# Patient Record
Sex: Female | Born: 1960 | Race: Black or African American | Hispanic: No | Marital: Single | Smoking: Never smoker
Health system: Southern US, Community
[De-identification: ages and names within clinical notes are randomized; demographics above are authoritative.]

## PROBLEM LIST (undated history)

## (undated) DIAGNOSIS — G43909 Migraine, unspecified, not intractable, without status migrainosus: Secondary | ICD-10-CM

## (undated) DIAGNOSIS — E039 Hypothyroidism, unspecified: Secondary | ICD-10-CM

## (undated) DIAGNOSIS — E669 Obesity, unspecified: Secondary | ICD-10-CM

## (undated) DIAGNOSIS — R001 Bradycardia, unspecified: Secondary | ICD-10-CM

## (undated) DIAGNOSIS — I10 Essential (primary) hypertension: Secondary | ICD-10-CM

## (undated) DIAGNOSIS — R002 Palpitations: Secondary | ICD-10-CM

## (undated) DIAGNOSIS — G4730 Sleep apnea, unspecified: Secondary | ICD-10-CM

## (undated) DIAGNOSIS — D219 Benign neoplasm of connective and other soft tissue, unspecified: Secondary | ICD-10-CM

## (undated) DIAGNOSIS — K589 Irritable bowel syndrome without diarrhea: Secondary | ICD-10-CM

## (undated) DIAGNOSIS — K449 Diaphragmatic hernia without obstruction or gangrene: Secondary | ICD-10-CM

## (undated) DIAGNOSIS — R079 Chest pain, unspecified: Secondary | ICD-10-CM

## (undated) DIAGNOSIS — D472 Monoclonal gammopathy: Secondary | ICD-10-CM

## (undated) DIAGNOSIS — I8393 Asymptomatic varicose veins of bilateral lower extremities: Secondary | ICD-10-CM

## (undated) DIAGNOSIS — K219 Gastro-esophageal reflux disease without esophagitis: Secondary | ICD-10-CM

## (undated) HISTORY — DX: Monoclonal gammopathy: D47.2

## (undated) HISTORY — PX: ESOPHAGOGASTRODUODENOSCOPY: SHX1529

## (undated) HISTORY — DX: Irritable bowel syndrome, unspecified: K58.9

---

## 2005-11-24 ENCOUNTER — Ambulatory Visit: Payer: Self-pay

## 2008-04-10 ENCOUNTER — Ambulatory Visit: Payer: Self-pay | Admitting: Obstetrics and Gynecology

## 2009-09-23 ENCOUNTER — Ambulatory Visit: Payer: Self-pay | Admitting: Internal Medicine

## 2009-10-10 HISTORY — PX: COLONOSCOPY: SHX174

## 2010-07-01 ENCOUNTER — Other Ambulatory Visit: Payer: Self-pay

## 2010-09-30 ENCOUNTER — Ambulatory Visit: Payer: Self-pay

## 2011-08-19 ENCOUNTER — Emergency Department: Payer: Self-pay | Admitting: Emergency Medicine

## 2011-12-16 ENCOUNTER — Ambulatory Visit: Payer: Self-pay

## 2012-09-10 ENCOUNTER — Other Ambulatory Visit: Payer: Self-pay

## 2012-09-10 LAB — CBC WITH DIFFERENTIAL/PLATELET
Basophil #: 0 10*3/uL (ref 0.0–0.1)
Eosinophil %: 3.5 %
HCT: 35.3 % (ref 35.0–47.0)
Lymphocyte %: 32.4 %
MCV: 90 fL (ref 80–100)
Monocyte %: 10.7 %
Neutrophil #: 2.9 10*3/uL (ref 1.4–6.5)
Platelet: 297 10*3/uL (ref 150–440)
RBC: 3.91 10*6/uL (ref 3.80–5.20)
RDW: 13.3 % (ref 11.5–14.5)
WBC: 5.6 10*3/uL (ref 3.6–11.0)

## 2012-09-10 LAB — COMPREHENSIVE METABOLIC PANEL
Albumin: 3.6 g/dL (ref 3.4–5.0)
Alkaline Phosphatase: 75 U/L (ref 50–136)
BUN: 14 mg/dL (ref 7–18)
Chloride: 102 mmol/L (ref 98–107)
EGFR (African American): >60
Glucose: 88 mg/dL (ref 65–99)
SGOT(AST): 16 U/L (ref 15–37)
SGPT (ALT): 21 U/L (ref 12–78)
Total Protein: 8.6 g/dL — ABNORMAL HIGH (ref 6.4–8.2)

## 2012-09-10 LAB — LIPID PANEL
HDL Cholesterol: 73 mg/dL — ABNORMAL HIGH (ref 40–60)
Triglycerides: 41 mg/dL (ref 0–200)

## 2012-09-10 LAB — TSH: Thyroid Stimulating Horm: 2.05 u[IU]/mL

## 2012-12-25 ENCOUNTER — Ambulatory Visit: Payer: Self-pay

## 2013-02-03 ENCOUNTER — Emergency Department (HOSPITAL_COMMUNITY)
Admission: EM | Admit: 2013-02-03 | Discharge: 2013-02-03 | Disposition: A | Discharge status: Home / Self Care | Payer: PRIVATE HEALTH INSURANCE | Attending: Emergency Medicine | Admitting: Emergency Medicine

## 2013-02-03 ENCOUNTER — Encounter (HOSPITAL_COMMUNITY): Payer: Self-pay | Admitting: *Deleted

## 2013-02-03 ENCOUNTER — Emergency Department (HOSPITAL_COMMUNITY): Payer: PRIVATE HEALTH INSURANCE

## 2013-02-03 DIAGNOSIS — Z8742 Personal history of other diseases of the female genital tract: Secondary | ICD-10-CM | POA: Insufficient documentation

## 2013-02-03 DIAGNOSIS — N390 Urinary tract infection, site not specified: Secondary | ICD-10-CM

## 2013-02-03 DIAGNOSIS — R143 Flatulence: Secondary | ICD-10-CM | POA: Insufficient documentation

## 2013-02-03 DIAGNOSIS — Z3202 Encounter for pregnancy test, result negative: Secondary | ICD-10-CM | POA: Insufficient documentation

## 2013-02-03 DIAGNOSIS — D259 Leiomyoma of uterus, unspecified: Secondary | ICD-10-CM

## 2013-02-03 DIAGNOSIS — Z79899 Other long term (current) drug therapy: Secondary | ICD-10-CM | POA: Insufficient documentation

## 2013-02-03 DIAGNOSIS — R142 Eructation: Secondary | ICD-10-CM | POA: Insufficient documentation

## 2013-02-03 DIAGNOSIS — R109 Unspecified abdominal pain: Secondary | ICD-10-CM

## 2013-02-03 DIAGNOSIS — R141 Gas pain: Secondary | ICD-10-CM | POA: Insufficient documentation

## 2013-02-03 LAB — URINALYSIS, ROUTINE W REFLEX MICROSCOPIC
Ketones, ur: NEGATIVE mg/dL
Specific Gravity, Urine: >1.03 — ABNORMAL HIGH (ref 1.005–1.030)

## 2013-02-03 LAB — CBC WITH DIFFERENTIAL/PLATELET
HCT: 36.5 % (ref 36.0–46.0)
Hemoglobin: 12.4 g/dL (ref 12.0–15.0)
Lymphocytes Relative: 32 % (ref 12–46)
Lymphs Abs: 2.2 10*3/uL (ref 0.7–4.0)
MCHC: 34 g/dL (ref 30.0–36.0)
Monocytes Absolute: 0.8 10*3/uL (ref 0.1–1.0)
Monocytes Relative: 12 % (ref 3–12)
Neutro Abs: 3.5 10*3/uL (ref 1.7–7.7)
RBC: 4.11 MIL/uL (ref 3.87–5.11)
WBC: 6.7 10*3/uL (ref 4.0–10.5)

## 2013-02-03 LAB — BASIC METABOLIC PANEL
BUN: 10 mg/dL (ref 6–23)
CO2: 30 mEq/L (ref 19–32)
Chloride: 99 mEq/L (ref 96–112)
Creatinine, Ser: 0.68 mg/dL (ref 0.50–1.10)

## 2013-02-03 LAB — URINE MICROSCOPIC-ADD ON

## 2013-02-03 MED ORDER — SODIUM CHLORIDE 0.9 % IV BOLUS (SEPSIS)
250.0000 mL | Freq: Once | INTRAVENOUS | Status: AC
  Administered 2013-02-03: 250 mL via INTRAVENOUS

## 2013-02-03 MED ORDER — ONDANSETRON HCL 4 MG/2ML IJ SOLN
4.0000 mg | Freq: Once | INTRAMUSCULAR | Status: AC
  Administered 2013-02-03: 4 mg via INTRAVENOUS
  Filled 2013-02-03: qty 2

## 2013-02-03 MED ORDER — IBUPROFEN 800 MG PO TABS: 800.0000 mg | ORAL_TABLET | Freq: Three times a day (TID) | ORAL | Status: AC

## 2013-02-03 MED ORDER — IOHEXOL 300 MG/ML  SOLN
100.0000 mL | Freq: Once | INTRAMUSCULAR | Status: AC | PRN
  Administered 2013-02-03: 100 mL via INTRAVENOUS

## 2013-02-03 MED ORDER — IOHEXOL 300 MG/ML  SOLN
50.0000 mL | Freq: Once | INTRAMUSCULAR | Status: AC | PRN
  Administered 2013-02-03: 50 mL via ORAL

## 2013-02-03 MED ORDER — SODIUM CHLORIDE 0.9 % IV SOLN: INTRAVENOUS | Status: DC

## 2013-02-03 MED ORDER — SODIUM CHLORIDE 0.9 % IV SOLN
INTRAVENOUS | Status: DC
  Administered 2013-02-03: 14:00:00 via INTRAVENOUS

## 2013-02-03 MED ORDER — TRAMADOL HCL 50 MG PO TABS: 50.0000 mg | ORAL_TABLET | Freq: Four times a day (QID) | ORAL | Status: DC | PRN

## 2013-02-03 MED ORDER — SULFAMETHOXAZOLE-TRIMETHOPRIM 800-160 MG PO TABS: 1.0000 | ORAL_TABLET | Freq: Two times a day (BID) | ORAL | Status: DC

## 2013-02-03 NOTE — ED Notes (Signed)
Pt c/o abd swelling over the past few days, admits to "some" pain, denies any n/v/d, lat bowel movement was yesterday and normal per pt.

## 2013-02-03 NOTE — ED Provider Notes (Addendum)
History     CSN: [id number]  Arrival date & time 02/03/13  1112   First MD Initiated Contact with Patient 02/03/13 1257      Chief Complaint  Patient presents with  . Abdominal Pain    (Consider location/radiation/quality/duration/timing/severity/associated sxs/prior treatment) Patient is a 52 y.o. female presenting with abdominal pain. The history is provided by the patient.  Abdominal Pain Associated symptoms: no chest pain, no chills, no diarrhea, no dysuria, no fever, no hematuria, no nausea, no shortness of breath, no vaginal bleeding, no vaginal discharge and no vomiting    patient presents with a several day history approximately 3 day history of lower [place] swelling and discomfort the pain is described as an ache it is minimal 3/10 not associated any nausea vomiting or diarrhea patient did state her last bowel movement was yesterday no fevers. Patient has history of uterine fibroids in the past. Has not had a fibroids evaluate for the past 2 years. Her primary care doctors in [place]. Patient denies any dysuria. Patient is undergoing menopause so her periods have been irregular. No prior abdominal surgery. The lower quadrant abdominal pain is nonradiating described as an ache 3/10 as mentioned above. Not made worse or better by anything.  History reviewed. No pertinent past medical history.  History reviewed. No pertinent past surgical history.  No family history on file.  History  Substance Use Topics  . Smoking status: Never Smoker   . Smokeless tobacco: Not on file  . Alcohol Use: No    OB History   Grav Para Term Preterm Abortions TAB SAB Ect Mult Living                  Review of Systems  Constitutional: Negative for fever, chills and appetite change.  HENT: Negative for congestion.   Eyes: Negative for visual disturbance.  Respiratory: Negative for shortness of breath.   Cardiovascular: Negative for chest pain.  Gastrointestinal: Positive for  abdominal pain and abdominal distention. Negative for nausea, vomiting and diarrhea.  Genitourinary: Negative for dysuria, hematuria, vaginal bleeding and vaginal discharge.  Musculoskeletal: Negative for myalgias and back pain.  Skin: Negative for rash.  Neurological: Negative for headaches.  Hematological: Does not bruise/bleed easily.  Psychiatric/Behavioral: Negative for confusion.    Allergies  Review of patient's allergies indicates no known allergies.  Home Medications   Current Outpatient Rx  Name  Route  Sig  Dispense  Refill  . lisinopril (PRINIVIL,ZESTRIL) 10 MG tablet   Oral   Take 10 mg by mouth daily.           BP 121/69  Pulse 73  Temp(Src) 98.1 F (36.7 C) (Oral)  Resp 20  Ht 5\' 2"  (1.575 m)  Wt 287 lb (130.182 kg)  BMI 52.48 kg/m2  SpO2 100%  Physical Exam  Nursing note and vitals reviewed. Constitutional: She is oriented to person, place, and time. She appears well-developed and well-nourished. No distress.  HENT:  Head: Normocephalic and atraumatic.  Eyes: Conjunctivae and EOM are normal. Pupils are equal, round, and reactive to light. No scleral icterus.  Neck: Normal range of motion.  Cardiovascular: Normal rate, regular rhythm and normal heart sounds.   No murmur heard. Pulmonary/Chest: Effort normal and breath sounds normal. No respiratory distress.  Abdominal: Soft. Bowel sounds are normal. She exhibits no mass. There is tenderness. There is no guarding.  Mild tenderness lower corner and so the abdomen not able to appreciate any mass or significant distention no guarding.  Musculoskeletal: Normal range of motion. She exhibits no edema.  Neurological: She is alert and oriented to person, place, and time. No cranial nerve deficit. She exhibits normal muscle tone. Coordination normal.  Skin: Skin is warm. No erythema.    ED Course  Procedures (including critical care time)  Labs Reviewed  URINALYSIS, ROUTINE W REFLEX MICROSCOPIC - Abnormal;  Notable for the following:    APPearance HAZY (*)    Specific Gravity, Urine >1.030 (*)    Hgb urine dipstick SMALL (*)    Leukocytes, UA TRACE (*)    All other components within normal limits  URINE MICROSCOPIC-ADD ON - Abnormal; Notable for the following:    Squamous Epithelial / LPF MANY (*)    Bacteria, UA MANY (*)    All other components within normal limits  URINE CULTURE  PREGNANCY, URINE  CBC WITH DIFFERENTIAL  BASIC METABOLIC PANEL   No results found. Results for orders placed during the hospital encounter of 02/03/13  URINALYSIS, ROUTINE W REFLEX MICROSCOPIC      Result Value Range   Color, Urine YELLOW  YELLOW   APPearance HAZY (*) CLEAR   Specific Gravity, Urine >1.030 (*) 1.005 - 1.030   pH 6.0  5.0 - 8.0   Glucose, UA NEGATIVE  NEGATIVE mg/dL   Hgb urine dipstick SMALL (*) NEGATIVE   Bilirubin Urine NEGATIVE  NEGATIVE   Ketones, ur NEGATIVE  NEGATIVE mg/dL   Protein, ur NEGATIVE  NEGATIVE mg/dL   Urobilinogen, UA 0.2  0.0 - 1.0 mg/dL   Nitrite NEGATIVE  NEGATIVE   Leukocytes, UA TRACE (*) NEGATIVE  PREGNANCY, URINE      Result Value Range   Preg Test, Ur NEGATIVE  NEGATIVE  URINE MICROSCOPIC-ADD ON      Result Value Range   Squamous Epithelial / LPF MANY (*) RARE   WBC, UA 11-20  <3 WBC/hpf   RBC / HPF 7-10  <3 RBC/hpf   Bacteria, UA MANY (*) RARE  CBC WITH DIFFERENTIAL      Result Value Range   WBC 6.7  4.0 - 10.5 K/uL   RBC 4.11  3.87 - 5.11 MIL/uL   Hemoglobin 12.4  12.0 - 15.0 g/dL   HCT 16.1  09.6 - 04.5 %   MCV 88.8  78.0 - 100.0 fL   MCH 30.2  26.0 - 34.0 pg   MCHC 34.0  30.0 - 36.0 g/dL   RDW 40.9  81.1 - 91.4 %   Platelets 303  150 - 400 K/uL   Neutrophils Relative 52  43 - 77 %   Neutro Abs 3.5  1.7 - 7.7 K/uL   Lymphocytes Relative 32  12 - 46 %   Lymphs Abs 2.2  0.7 - 4.0 K/uL   Monocytes Relative 12  3 - 12 %   Monocytes Absolute 0.8  0.1 - 1.0 K/uL   Eosinophils Relative 3  0 - 5 %   Eosinophils Absolute 0.2  0.0 - 0.7 K/uL    Basophils Relative 0  0 - 1 %   Basophils Absolute 0.0  0.0 - 0.1 K/uL  BASIC METABOLIC PANEL      Result Value Range   Sodium 136  135 - 145 mEq/L   Potassium 3.9  3.5 - 5.1 mEq/L   Chloride 99  96 - 112 mEq/L   CO2 30  19 - 32 mEq/L   Glucose, Bld 96  70 - 99 mg/dL   BUN 10  6 - 23 mg/dL  Creatinine, Ser 0.68  0.50 - 1.10 mg/dL   Calcium 9.6  8.4 - 78.2 mg/dL   GFR calc non Af Amer >90  >90 mL/min   GFR calc Af Amer >90  >90 mL/min     1. Abdominal pain       MDM  Patient primary care doctors in [place]. Patient presents with complaint of lower abdominal swelling and ache discomfort for the past few days. Patient has a past history of uterine fibroids but has not been evaluated for that for 2 years. Patient is not having increased vaginal bleeding. Patient denies any nausea vomiting or diarrhea. Patient's lab workup with no leukocytosis or anemia electrolytes are normal with good renal function. Urinalysis is abnormal but does have many squamous cells. So may be contamination urine culture has been sent the possibility of a urinary tract infection is possible. If rest of workup is negative treatment with antibiotic may be reasonable. CT scan of the abdomen is pending it is also possible that her uterine fibroids are responsible for the swelling and the discomfort.        Shelda Jakes, MD 02/03/13 1503  Shelda Jakes, MD 02/04/13 [phone]

## 2013-02-03 NOTE — ED Provider Notes (Signed)
Patient signed out to me to followup on CAT scan. Patient had been seen for low pelvic pain and cramping. She does have a history of fibroids. CAT scan was ordered to evaluate for other possible causes of these symptoms. CAT scan did not show any new findings. She does have some suggestion of urinary tract infection, although there were a lot of epithelial cells on the UA, will treat empirically. Provide analgesia. Patient is to followup with her OB/GYN at home.  Yolanda Crease, MD 02/03/13 [phone]

## 2013-02-04 LAB — URINE CULTURE

## 2013-06-13 ENCOUNTER — Ambulatory Visit: Payer: Self-pay | Admitting: Unknown Physician Specialty

## 2013-10-31 DIAGNOSIS — D219 Benign neoplasm of connective and other soft tissue, unspecified: Secondary | ICD-10-CM | POA: Insufficient documentation

## 2014-01-17 ENCOUNTER — Ambulatory Visit: Payer: Self-pay | Admitting: Oncology

## 2014-01-17 ENCOUNTER — Ambulatory Visit: Admit: 2014-01-17 | Disposition: A | Discharge status: Home / Self Care | Payer: Self-pay | Admitting: Oncology

## 2014-02-10 ENCOUNTER — Ambulatory Visit: Payer: Self-pay | Admitting: Oncology

## 2014-03-10 ENCOUNTER — Ambulatory Visit: Payer: Self-pay | Admitting: Oncology

## 2014-03-12 ENCOUNTER — Ambulatory Visit: Payer: Self-pay | Admitting: Internal Medicine

## 2014-04-09 ENCOUNTER — Ambulatory Visit: Payer: Self-pay | Admitting: Oncology

## 2014-04-09 LAB — CBC CANCER CENTER
BASOS PCT: 0.8 %
Basophil #: 0.1 x10 3/mm (ref 0.0–0.1)
Eosinophil #: 0.3 x10 3/mm (ref 0.0–0.7)
Eosinophil %: 3.6 %
HCT: 38.8 % (ref 35.0–47.0)
HGB: 12.9 g/dL (ref 12.0–16.0)
LYMPHS PCT: 21.9 %
Lymphocyte #: 1.8 x10 3/mm (ref 1.0–3.6)
MCH: 30.1 pg (ref 26.0–34.0)
MCHC: 33.2 g/dL (ref 32.0–36.0)
MCV: 91 fL (ref 80–100)
MONO ABS: 1 x10 3/mm — AB (ref 0.2–0.9)
MONOS PCT: 12.5 %
NEUTROS PCT: 61.2 %
Neutrophil #: 4.9 x10 3/mm (ref 1.4–6.5)
Platelet: 285 x10 3/mm (ref 150–440)
RBC: 4.28 10*6/uL (ref 3.80–5.20)
RDW: 13.8 % (ref 11.5–14.5)
WBC: 8.1 x10 3/mm (ref 3.6–11.0)

## 2014-04-09 LAB — IRON AND TIBC
IRON SATURATION: 17 %
Iron Bind.Cap.(Total): 237 ug/dL — ABNORMAL LOW (ref 250–450)
Iron: 41 ug/dL — ABNORMAL LOW (ref 50–170)
Unbound Iron-Bind.Cap.: 196 ug/dL

## 2014-04-09 LAB — FERRITIN: Ferritin (ARMC): 247 ng/mL (ref 8–388)

## 2014-05-10 ENCOUNTER — Ambulatory Visit: Payer: Self-pay | Admitting: Oncology

## 2014-08-25 DIAGNOSIS — R002 Palpitations: Secondary | ICD-10-CM | POA: Insufficient documentation

## 2014-08-25 DIAGNOSIS — R079 Chest pain, unspecified: Secondary | ICD-10-CM | POA: Insufficient documentation

## 2014-08-25 DIAGNOSIS — D509 Iron deficiency anemia, unspecified: Secondary | ICD-10-CM | POA: Insufficient documentation

## 2014-08-25 DIAGNOSIS — G4733 Obstructive sleep apnea (adult) (pediatric): Secondary | ICD-10-CM | POA: Insufficient documentation

## 2014-08-25 DIAGNOSIS — I8390 Asymptomatic varicose veins of unspecified lower extremity: Secondary | ICD-10-CM | POA: Insufficient documentation

## 2014-08-25 DIAGNOSIS — E039 Hypothyroidism, unspecified: Secondary | ICD-10-CM | POA: Insufficient documentation

## 2014-08-25 DIAGNOSIS — K219 Gastro-esophageal reflux disease without esophagitis: Secondary | ICD-10-CM | POA: Insufficient documentation

## 2014-08-25 DIAGNOSIS — G723 Periodic paralysis: Secondary | ICD-10-CM | POA: Insufficient documentation

## 2014-09-17 DIAGNOSIS — R0681 Apnea, not elsewhere classified: Secondary | ICD-10-CM | POA: Insufficient documentation

## 2014-09-17 DIAGNOSIS — R001 Bradycardia, unspecified: Secondary | ICD-10-CM | POA: Insufficient documentation

## 2015-02-12 ENCOUNTER — Other Ambulatory Visit: Payer: Self-pay

## 2015-02-12 DIAGNOSIS — Z1231 Encounter for screening mammogram for malignant neoplasm of breast: Secondary | ICD-10-CM

## 2015-03-17 ENCOUNTER — Ambulatory Visit
Admission: RE | Admit: 2015-03-17 | Discharge: 2015-03-17 | Disposition: A | Discharge status: Home / Self Care | Payer: PRIVATE HEALTH INSURANCE | Source: Ambulatory Visit | Attending: Diagnostic Radiology | Admitting: Diagnostic Radiology

## 2015-03-17 DIAGNOSIS — Z1231 Encounter for screening mammogram for malignant neoplasm of breast: Secondary | ICD-10-CM | POA: Insufficient documentation

## 2015-03-19 DIAGNOSIS — I10 Essential (primary) hypertension: Secondary | ICD-10-CM | POA: Insufficient documentation

## 2016-01-09 DIAGNOSIS — I10 Essential (primary) hypertension: Secondary | ICD-10-CM | POA: Insufficient documentation

## 2016-01-29 ENCOUNTER — Encounter: Payer: Self-pay | Admitting: Sports Medicine

## 2016-01-29 ENCOUNTER — Other Ambulatory Visit: Payer: Self-pay | Admitting: Sports Medicine

## 2016-01-29 ENCOUNTER — Ambulatory Visit (INDEPENDENT_AMBULATORY_CARE_PROVIDER_SITE_OTHER): Payer: PRIVATE HEALTH INSURANCE | Admitting: Sports Medicine

## 2016-01-29 ENCOUNTER — Ambulatory Visit (INDEPENDENT_AMBULATORY_CARE_PROVIDER_SITE_OTHER): Payer: PRIVATE HEALTH INSURANCE

## 2016-01-29 DIAGNOSIS — M79671 Pain in right foot: Secondary | ICD-10-CM

## 2016-01-29 DIAGNOSIS — M79672 Pain in left foot: Secondary | ICD-10-CM | POA: Diagnosis not present

## 2016-01-29 DIAGNOSIS — M7661 Achilles tendinitis, right leg: Secondary | ICD-10-CM

## 2016-01-29 MED ORDER — MELOXICAM 15 MG PO TABS: 15.0000 mg | ORAL_TABLET | Freq: Every day | ORAL | Status: DC

## 2016-01-30 NOTE — Progress Notes (Signed)
Patient ID: Labrisha Edgett, female   DOB: [birth year], 55 y.o.   MRN: [id number]   Subjective: Alicyn Suon is a 55 y.o. Obese female patient who presents to office for evaluation of Right heel pain. Patient complains of pain since about 6 weeks ago; patient was on vacation on and used her right foot to push off the wall to get off the bed and felt a pull in the back of her heel; patient went to urgent care where she was treated with steroid and pain medication with improvement; reports that over the last few weeks it has gotten better; admits there is still mild swelling that she has been using ACE wrap for. Reports that pain is worse in sandals and in flats. Patient denies any other pedal complaints.   Patient Active Problem List   Diagnosis Date Noted  . Essential (primary) hypertension 01/09/2016  . Benign essential HTN 03/19/2015  . Bradycardia 09/17/2014  . Breathlessness on exertion 09/17/2014  . Chest pain 08/25/2014  . Gastro-esophageal reflux disease without esophagitis 08/25/2014  . 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 Awareness of heartbeats 08/25/2014  . Anemia, iron deficiency 08/25/2014  . Obstructive apnea 08/25/2014  . Adult hypothyroidism 08/25/2014  . Leg varices 08/25/2014  . Fibroid 10/31/2013  . Morbid obesity ([place]) 10/31/2013    Current Outpatient Prescriptions on File Prior to Visit  Medication Sig Dispense Refill  . ibuprofen (ADVIL,MOTRIN) 800 MG tablet Take 1 tablet (800 mg total) by mouth 3 (three) times daily. 21 tablet 0  . lisinopril (PRINIVIL,ZESTRIL) 10 MG tablet Take 10 mg by mouth daily.    Marland Kitchen sulfamethoxazole-trimethoprim (SEPTRA DS) 800-160 MG per tablet Take 1 tablet by mouth every 12 (twelve) hours. 14 tablet 0  . traMADol (ULTRAM) 50 MG tablet Take 1 tablet (50 mg total) by mouth every 6 (six) hours as needed for pain. 15 tablet 0   No current facility-administered medications on file prior to visit.    Allergies  Allergen Reactions  . Lisinopril Cough     Objective:  General: Alert and oriented x3 in no acute distress  Dermatology: No open lesions bilateral lower extremities, no webspace macerations, no ecchymosis bilateral, all nails x 10 are well manicured.  Vascular: Dorsalis Pedis 2/4 and Posterior Tibial pedal pulses 1/4, Capillary Fill Time 3 seconds, + pedal hair growth bilateral, no focal edema bilateral lower extremities, generalized large habitus, Temperature gradient within normal limits.  Neurology: Gross sensation intact via light touch bilateral. - Tinels sign right.   Musculoskeletal: Mild tenderness with palpation at insertion of the Achilles on Right, there is minimal calcaneal exostosis with mild soft tissue swelling present, decreased ankle rom with knee extending  vs flexed resembling gastroc equnius bilateral, The achilles tendon feels intact with no nodularity or palpable dell, Thompson sign negative, Subtalar and midtarsal joint range of motion is within normal limits, there is no 1st ray hypermobility or forefoot deformity noted bilateral. Pes planus foot type bilateral.   Xrays  Right Foot    Impression: Normal osseous mineralization. Joint spaces preserved with severe midtarsal breach supportive of pes planus. No fracture/dislocation/boney destruction. Calcaneal spur present. Kager's triangle intact with no obliteration. No soft tissue abnormalities or radiopaque foreign bodies.   Assessment and Plan: Problem List Items Addressed This Visit    None    Visit Diagnoses    Right foot pain    -  Primary    Relevant Medications    meloxicam (MOBIC) 15 MG tablet    Tendonitis,  Achilles, right        Relevant Medications    meloxicam (MOBIC) 15 MG tablet      -Complete examination performed -Xrays reviewed -Discussed treatement options -Dispensed night splint -Rx Mobic to take as need for pain and inflammation -Dispensed felt heel lift and recommend patient to use good supportive shoes -Advised ice 1-2x  daily to affected area -If no improvement will consider MRI/PT/EPAT -Patient to return to office in 3-4 weeks or sooner if condition worsens.  Landis Martins, DPM

## 2016-02-26 ENCOUNTER — Ambulatory Visit (INDEPENDENT_AMBULATORY_CARE_PROVIDER_SITE_OTHER): Payer: PRIVATE HEALTH INSURANCE | Admitting: Sports Medicine

## 2016-02-26 ENCOUNTER — Encounter: Payer: Self-pay | Admitting: Sports Medicine

## 2016-02-26 DIAGNOSIS — M79671 Pain in right foot: Secondary | ICD-10-CM | POA: Diagnosis not present

## 2016-02-26 DIAGNOSIS — M7661 Achilles tendinitis, right leg: Secondary | ICD-10-CM

## 2016-02-26 NOTE — Progress Notes (Signed)
Patient ID: Enola Halt, female   DOB: January 03, 1961, 55 y.o.   MRN: [place]:[id number]  Subjective: Jorgia Venerable is a 55 y.o. Obese female patient who returns to office for evaluation of Right achilles/ heel pain. Patient states that her heel feels much better. Has been tolerating night splint and heel lift fine. Ice helps a lot. Patient denies any other pedal complaints.   Patient Active Problem List   Diagnosis Date Noted  . Essential (primary) hypertension 01/09/2016  . Benign essential HTN 03/19/2015  . Bradycardia 09/17/2014  . Breathlessness on exertion 09/17/2014  . Chest pain 08/25/2014  . Gastro-esophageal reflux disease without esophagitis 08/25/2014  . 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 Awareness of heartbeats 08/25/2014  . Anemia, iron deficiency 08/25/2014  . Obstructive apnea 08/25/2014  . Adult hypothyroidism 08/25/2014  . Leg varices 08/25/2014  . Fibroid 10/31/2013  . Morbid obesity ([place]) 10/31/2013    Current Outpatient Prescriptions on File Prior to Visit  Medication Sig Dispense Refill  . clotrimazole-betamethasone (LOTRISONE) cream Apply topically.    Marland Kitchen HYDROcodone-acetaminophen (NORCO/VICODIN) 5-325 MG tablet Take by mouth.    Marland Kitchen ibuprofen (ADVIL,MOTRIN) 800 MG tablet Take 1 tablet (800 mg total) by mouth 3 (three) times daily. 21 tablet 0  . levofloxacin (LEVAQUIN) 500 MG tablet     . linaclotide (LINZESS) 290 MCG CAPS capsule Take by mouth.    Marland Kitchen lisinopril (PRINIVIL,ZESTRIL) 10 MG tablet Take 10 mg by mouth daily.    . meloxicam (MOBIC) 15 MG tablet Take 1 tablet (15 mg total) by mouth daily. 30 tablet 0  . omeprazole (PRILOSEC) 40 MG capsule Take by mouth.    . predniSONE (DELTASONE) 20 MG tablet Take 60 mg by mouth daily.  0  . sulfamethoxazole-trimethoprim (SEPTRA DS) 800-160 MG per tablet Take 1 tablet by mouth every 12 (twelve) hours. 14 tablet 0  . traMADol (ULTRAM) 50 MG tablet Take 1 tablet (50 mg total) by mouth every 6 (six) hours as needed for pain. 15 tablet 0   . triamterene-hydrochlorothiazide (DYAZIDE) 37.5-25 MG capsule      No current facility-administered medications on file prior to visit.    Allergies  Allergen Reactions  . Lisinopril Cough    Objective:  General: Alert and oriented x3 in no acute distress  Dermatology: No open lesions bilateral lower extremities, no webspace macerations, no ecchymosis bilateral, all nails x 10 are well manicured.  Vascular: Dorsalis Pedis 2/4 and Posterior Tibial pedal pulses 1/4, Capillary Fill Time 3 seconds, + pedal hair growth bilateral, no focal edema bilateral lower extremities, generalized large habitus, Temperature gradient within normal limits.  Neurology: Gross sensation intact via light touch bilateral. - Tinels sign right.   Musculoskeletal: No tenderness with palpation at insertion of the Achilles on Right, there is minimal calcaneal exostosis with decreased soft tissue swelling present, decreased ankle rom with knee extending  vs flexed resembling gastroc equnius bilateral, The achilles tendon feels intact with no nodularity or palpable dell, Thompson sign negative, Subtalar and midtarsal joint range of motion is within normal limits, there is no 1st ray hypermobility or forefoot deformity noted bilateral. Pes planus foot type bilateral.   Assessment and Plan: Problem List Items Addressed This Visit    None    Visit Diagnoses    Right foot pain    -  Primary    Tendonitis, Achilles, right        improved      -Complete examination performed -Discussed continued plan of care -Advised patient to  wean from night splint -Continue with Mobic until completed -Continue with felt heel lift and recommend patient to use good supportive shoes -Advised ice 1-2x daily to affected area as needed -If re-curs will consider MRI/PT/EPAT -Patient to return to office in 8 weeks or sooner if condition worsens.  Landis Martins, DPM

## 2016-04-22 ENCOUNTER — Ambulatory Visit (INDEPENDENT_AMBULATORY_CARE_PROVIDER_SITE_OTHER): Payer: PRIVATE HEALTH INSURANCE | Admitting: Sports Medicine

## 2016-04-22 ENCOUNTER — Encounter: Payer: Self-pay | Admitting: Sports Medicine

## 2016-04-22 DIAGNOSIS — M7661 Achilles tendinitis, right leg: Secondary | ICD-10-CM | POA: Diagnosis not present

## 2016-04-22 DIAGNOSIS — M79671 Pain in right foot: Secondary | ICD-10-CM | POA: Diagnosis not present

## 2016-04-22 NOTE — Progress Notes (Signed)
Patient ID: Yolanda Stephens, female   DOB: [birth year], 55 y.o.   MRN: [place]:[id number] Subjective: Yolanda Stephens is a 55 y.o. Obese female patient who returns to office for evaluation of Right achilles/ heel pain. Patient states that her heel feels much better. Has had no problems and has been able to do her normal activities without pain. Patient denies any other pedal complaints.   Patient Active Problem List   Diagnosis Date Noted  . Essential (primary) hypertension 01/09/2016  . Benign essential HTN 03/19/2015  . Bradycardia 09/17/2014  . Breathlessness on exertion 09/17/2014  . Chest pain 08/25/2014  . Gastro-esophageal reflux disease without esophagitis 08/25/2014  . 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 Awareness of heartbeats 08/25/2014  . Anemia, iron deficiency 08/25/2014  . Obstructive apnea 08/25/2014  . Adult hypothyroidism 08/25/2014  . Leg varices 08/25/2014  . Fibroid 10/31/2013  . Morbid obesity ([place]) 10/31/2013    Current Outpatient Prescriptions on File Prior to Visit  Medication Sig Dispense Refill  . clotrimazole-betamethasone (LOTRISONE) cream Apply topically.    Marland Kitchen HYDROcodone-acetaminophen (NORCO/VICODIN) 5-325 MG tablet Take by mouth.    Marland Kitchen ibuprofen (ADVIL,MOTRIN) 800 MG tablet Take 1 tablet (800 mg total) by mouth 3 (three) times daily. 21 tablet 0  . levofloxacin (LEVAQUIN) 500 MG tablet     . linaclotide (LINZESS) 290 MCG CAPS capsule Take by mouth.    Marland Kitchen lisinopril (PRINIVIL,ZESTRIL) 10 MG tablet Take 10 mg by mouth daily.    . meloxicam (MOBIC) 15 MG tablet Take 1 tablet (15 mg total) by mouth daily. 30 tablet 0  . omeprazole (PRILOSEC) 40 MG capsule Take by mouth.    . predniSONE (DELTASONE) 20 MG tablet Take 60 mg by mouth daily.  0  . sulfamethoxazole-trimethoprim (SEPTRA DS) 800-160 MG per tablet Take 1 tablet by mouth every 12 (twelve) hours. 14 tablet 0  . traMADol (ULTRAM) 50 MG tablet Take 1 tablet (50 mg total) by mouth every 6 (six) hours as needed for pain. 15  tablet 0  . triamterene-hydrochlorothiazide (DYAZIDE) 37.5-25 MG capsule      No current facility-administered medications on file prior to visit.    Allergies  Allergen Reactions  . Lisinopril Cough    Objective:  General: Alert and oriented x3 in no acute distress. Obese   Dermatology: No open lesions bilateral lower extremities, no webspace macerations, no ecchymosis bilateral, all nails x 10 are well manicured.  Vascular: Dorsalis Pedis 2/4 and Posterior Tibial pedal pulses 1/4, Capillary Fill Time 3 seconds, + pedal hair growth bilateral, no focal edema bilateral lower extremities, generalized large habitus, Temperature gradient within normal limits.  Neurology: Gross sensation intact via light touch bilateral. - Tinels sign right.   Musculoskeletal: No tenderness with palpation at insertion of the Achilles on Right, there is minimal calcaneal exostosis with no soft tissue swelling present, decreased ankle rom with knee extending  vs flexed resembling gastroc equnius bilateral, The achilles tendon feels intact with no nodularity or palpable dell, Thompson sign remains negative, Subtalar and midtarsal joint range of motion is within normal limits, there is no 1st ray hypermobility or forefoot deformity noted bilateral. Pes planus foot type bilateral.   Assessment and Plan: Problem List Items Addressed This Visit    None    Visit Diagnoses    Tendonitis, Achilles, right    -  Primary    resolved    Right foot pain        resolved      -Complete examination performed -  Discussed long term plan of care -Continue with felt heel lift and recommend patient to use good supportive shoes -Continue with activities as tolerated  -Patient to return to office as needed or sooner if condition worsens.  Landis Martins, DPM

## 2016-05-04 ENCOUNTER — Other Ambulatory Visit: Payer: Self-pay | Admitting: Family Medicine

## 2016-05-04 DIAGNOSIS — Z1231 Encounter for screening mammogram for malignant neoplasm of breast: Secondary | ICD-10-CM

## 2016-05-10 ENCOUNTER — Ambulatory Visit
Admission: RE | Admit: 2016-05-10 | Discharge: 2016-05-10 | Disposition: A | Discharge status: Home / Self Care | Payer: PRIVATE HEALTH INSURANCE | Source: Ambulatory Visit | Attending: Family Medicine | Admitting: Family Medicine

## 2016-05-10 DIAGNOSIS — Z1231 Encounter for screening mammogram for malignant neoplasm of breast: Secondary | ICD-10-CM | POA: Diagnosis not present

## 2016-06-03 DIAGNOSIS — Z8371 Family history of colonic polyps: Secondary | ICD-10-CM | POA: Insufficient documentation

## 2016-08-19 ENCOUNTER — Ambulatory Visit: Payer: PRIVATE HEALTH INSURANCE | Admitting: Anesthesiology

## 2016-08-19 ENCOUNTER — Encounter: Payer: Self-pay | Admitting: *Deleted

## 2016-08-19 ENCOUNTER — Encounter
Admission: RE | Disposition: A | Discharge status: Home / Self Care | Payer: Self-pay | Source: Ambulatory Visit | Attending: Unknown Physician Specialty

## 2016-08-19 ENCOUNTER — Ambulatory Visit
Admission: RE | Admit: 2016-08-19 | Discharge: 2016-08-19 | Disposition: A | Discharge status: Home / Self Care | Payer: PRIVATE HEALTH INSURANCE | Source: Ambulatory Visit | Attending: Unknown Physician Specialty | Admitting: Unknown Physician Specialty

## 2016-08-19 DIAGNOSIS — I10 Essential (primary) hypertension: Secondary | ICD-10-CM | POA: Insufficient documentation

## 2016-08-19 DIAGNOSIS — D649 Anemia, unspecified: Secondary | ICD-10-CM | POA: Insufficient documentation

## 2016-08-19 DIAGNOSIS — G4730 Sleep apnea, unspecified: Secondary | ICD-10-CM | POA: Diagnosis not present

## 2016-08-19 DIAGNOSIS — K219 Gastro-esophageal reflux disease without esophagitis: Secondary | ICD-10-CM | POA: Insufficient documentation

## 2016-08-19 DIAGNOSIS — Z8371 Family history of colonic polyps: Secondary | ICD-10-CM | POA: Insufficient documentation

## 2016-08-19 DIAGNOSIS — Z1211 Encounter for screening for malignant neoplasm of colon: Secondary | ICD-10-CM | POA: Diagnosis not present

## 2016-08-19 DIAGNOSIS — Z79899 Other long term (current) drug therapy: Secondary | ICD-10-CM | POA: Insufficient documentation

## 2016-08-19 DIAGNOSIS — K449 Diaphragmatic hernia without obstruction or gangrene: Secondary | ICD-10-CM | POA: Insufficient documentation

## 2016-08-19 DIAGNOSIS — I8393 Asymptomatic varicose veins of bilateral lower extremities: Secondary | ICD-10-CM | POA: Insufficient documentation

## 2016-08-19 DIAGNOSIS — R002 Palpitations: Secondary | ICD-10-CM | POA: Diagnosis not present

## 2016-08-19 DIAGNOSIS — G43909 Migraine, unspecified, not intractable, without status migrainosus: Secondary | ICD-10-CM | POA: Insufficient documentation

## 2016-08-19 DIAGNOSIS — E669 Obesity, unspecified: Secondary | ICD-10-CM | POA: Insufficient documentation

## 2016-08-19 DIAGNOSIS — I739 Peripheral vascular disease, unspecified: Secondary | ICD-10-CM | POA: Diagnosis not present

## 2016-08-19 DIAGNOSIS — K640 First degree hemorrhoids: Secondary | ICD-10-CM | POA: Diagnosis not present

## 2016-08-19 DIAGNOSIS — E039 Hypothyroidism, unspecified: Secondary | ICD-10-CM | POA: Diagnosis not present

## 2016-08-19 DIAGNOSIS — Z6841 Body Mass Index (BMI) 40.0 and over, adult: Secondary | ICD-10-CM | POA: Insufficient documentation

## 2016-08-19 HISTORY — DX: Obesity, unspecified: E66.9

## 2016-08-19 HISTORY — DX: Migraine, unspecified, not intractable, without status migrainosus: G43.909

## 2016-08-19 HISTORY — DX: Sleep apnea, unspecified: G47.30

## 2016-08-19 HISTORY — DX: Essential (primary) hypertension: I10

## 2016-08-19 HISTORY — DX: Benign neoplasm of connective and other soft tissue, unspecified: D21.9

## 2016-08-19 HISTORY — DX: Hypothyroidism, unspecified: E03.9

## 2016-08-19 HISTORY — PX: COLONOSCOPY: SHX5424

## 2016-08-19 HISTORY — DX: Palpitations: R00.2

## 2016-08-19 HISTORY — DX: Chest pain, unspecified: R07.9

## 2016-08-19 HISTORY — DX: Diaphragmatic hernia without obstruction or gangrene: K44.9

## 2016-08-19 HISTORY — DX: Asymptomatic varicose veins of bilateral lower extremities: I83.93

## 2016-08-19 HISTORY — DX: Gastro-esophageal reflux disease without esophagitis: K21.9

## 2016-08-19 HISTORY — DX: Bradycardia, unspecified: R00.1

## 2016-08-19 SURGERY — COLONOSCOPY
Anesthesia: General

## 2016-08-19 MED ORDER — FENTANYL CITRATE (PF) 100 MCG/2ML IJ SOLN
INTRAMUSCULAR | Status: DC | PRN
  Administered 2016-08-19: 50 ug via INTRAVENOUS

## 2016-08-19 MED ORDER — SODIUM CHLORIDE 0.9 % IV SOLN
INTRAVENOUS | Status: DC
  Administered 2016-08-19 (×2): via INTRAVENOUS

## 2016-08-19 MED ORDER — LIDOCAINE 2% (20 MG/ML) 5 ML SYRINGE
INTRAMUSCULAR | Status: DC | PRN
  Administered 2016-08-19: 40 mg via INTRAVENOUS

## 2016-08-19 MED ORDER — EPHEDRINE SULFATE 50 MG/ML IJ SOLN
INTRAMUSCULAR | Status: DC | PRN
Start: 2016-08-19 — End: 2016-08-19
  Administered 2016-08-19: 5 mg via INTRAVENOUS

## 2016-08-19 MED ORDER — MIDAZOLAM HCL 5 MG/5ML IJ SOLN
INTRAMUSCULAR | Status: DC | PRN
  Administered 2016-08-19: 1 mg via INTRAVENOUS

## 2016-08-19 MED ORDER — PHENYLEPHRINE HCL 10 MG/ML IJ SOLN
INTRAMUSCULAR | Status: DC | PRN
  Administered 2016-08-19: 100 ug via INTRAVENOUS

## 2016-08-19 MED ORDER — PROPOFOL 500 MG/50ML IV EMUL
INTRAVENOUS | Status: DC | PRN
  Administered 2016-08-19: 160 ug/kg/min via INTRAVENOUS

## 2016-08-19 MED ORDER — SODIUM CHLORIDE 0.9 % IV SOLN: INTRAVENOUS | Status: DC

## 2016-08-19 MED ORDER — PROPOFOL 10 MG/ML IV BOLUS
INTRAVENOUS | Status: DC | PRN
  Administered 2016-08-19: 100 mg via INTRAVENOUS

## 2016-08-19 NOTE — Transfer of Care (Signed)
Immediate Anesthesia Transfer of Care Note  Patient: Yolanda Stephens  Procedure(s) Performed: Procedure(s): COLONOSCOPY (N/A)  Patient Location: PACU and Endoscopy Unit  Anesthesia Type:General  Level of Consciousness: sedated  Airway & Oxygen Therapy: Patient Spontanous Breathing and Patient connected to nasal cannula oxygen  Post-op Assessment: Report given to RN and Post -op Vital signs reviewed and stable  Post vital signs: Reviewed and stable  Last Vitals:  Vitals:   08/19/16 1454  BP: (!) 142/80  Pulse: 70  Resp: 20  Temp: 37 C    Last Pain:  Vitals:   08/19/16 1454  TempSrc: Tympanic         Complications: No apparent anesthesia complications

## 2016-08-19 NOTE — Op Note (Signed)
[facility] Patient Name: Yolanda Stephens Procedure Date: 08/19/2016 4:08 PM MRN: [id number] Account #: [id number] Date of Birth: Jun 30, 1961 Admit Type: Outpatient Age: 55 Room: [facility] ENDO ROOM 1 Gender: Female Note Status: Finalized Procedure:            Colonoscopy Indications:          Screening for colorectal malignant neoplasm, FH colon                        polyps Providers:            Manya Silvas, MD Referring MD:         Ardyth Man. Santayana (Referring MD) Medicines:            Propofol per Anesthesia Complications:        No immediate complications. Procedure:            Pre-Anesthesia Assessment:                       - After reviewing the risks and benefits, the patient                        was deemed in satisfactory condition to undergo the                        procedure.                       After obtaining informed consent, the colonoscope was                        passed under direct vision. Throughout the procedure,                        the patient's blood pressure, pulse, and oxygen                        saturations were monitored continuously. The                        Colonoscope was introduced through the anus and                        advanced to the the cecum, identified by appendiceal                        orifice and ileocecal valve. The colonoscopy was                        performed without difficulty. The patient tolerated the                        procedure well. The quality of the bowel preparation                        was excellent. Findings:      Internal hemorrhoids were found during endoscopy. The hemorrhoids were       small and Grade I (internal hemorrhoids that do not prolapse).      The exam was otherwise without abnormality. Impression:           - Internal hemorrhoids.                       -  The examination was otherwise normal.                       - No specimens  collected. Recommendation:       - Repeat colonoscopy in 5 years for screening purposes. Manya Silvas, MD 08/19/2016 4:31:24 PM This report has been signed electronically. Number of Addenda: 0 Note Initiated On: 08/19/2016 4:08 PM Scope Withdrawal Time: 0 hours 8 minutes 35 seconds  Total Procedure Duration: 0 hours 13 minutes 36 seconds       [facility]

## 2016-08-19 NOTE — H&P (Signed)
Primary Care Physician:  [facility]ologist:  Dr. Vira Agar  Pre-Procedure History & Physical: HPI:  Yolanda Stephens is a 55 y.o. female is here for an colonoscopy.   Past Medical History:  Diagnosis Date  . Bradycardia   . Chest pain   . Fibroids   . GERD (gastroesophageal reflux disease)   . Heart palpitations   . Hiatal hernia   . Hypertension   . Hypothyroidism   . Migraines   . Obesity   . Sleep apnea   . Varicose veins of both lower extremities     Past Surgical History:  Procedure Laterality Date  . COLONOSCOPY    . ESOPHAGOGASTRODUODENOSCOPY      Prior to Admission medications   Medication Sig Start Date End Date Taking? Authorizing Provider  triamterene-hydrochlorothiazide (DYAZIDE) 37.5-25 MG capsule 1 capsule daily.  01/04/14  Yes Historical Provider, MD  clotrimazole-betamethasone (LOTRISONE) cream Apply topically 2 (two) times daily.     Historical Provider, MD  HYDROcodone-acetaminophen (NORCO/VICODIN) 5-325 MG tablet Take by mouth. 03/12/15   Historical Provider, MD  ibuprofen (ADVIL,MOTRIN) 800 MG tablet Take 1 tablet (800 mg total) by mouth 3 (three) times daily. Patient taking differently: Take 800 mg by mouth every 6 (six) hours as needed.  02/03/13   Orpah Greek, MD  levofloxacin (LEVAQUIN) 500 MG tablet  01/03/14   Historical Provider, MD  linaclotide Rolan Lipa) 290 MCG CAPS capsule Take by mouth.    Historical Provider, MD  lisinopril (PRINIVIL,ZESTRIL) 10 MG tablet Take 10 mg by mouth daily.    Historical Provider, MD  meloxicam (MOBIC) 15 MG tablet Take 1 tablet (15 mg total) by mouth daily. Patient not taking: Reported on 08/18/2016 01/29/16   Landis Martins, DPM  omeprazole (PRILOSEC) 40 MG capsule Take by mouth daily.     Historical Provider, MD  predniSONE (DELTASONE) 20 MG tablet Take 60 mg by mouth daily. 12/28/15   Historical Provider, MD  sulfamethoxazole-trimethoprim (SEPTRA DS) 800-160 MG per tablet Take  1 tablet by mouth every 12 (twelve) hours. Patient not taking: Reported on 08/18/2016 02/03/13   Orpah Greek, MD  traMADol (ULTRAM) 50 MG tablet Take 1 tablet (50 mg total) by mouth every 6 (six) hours as needed for pain. Patient not taking: Reported on 08/18/2016 02/03/13   Orpah Greek, MD    Allergies as of 06/27/2016 - Review Complete 04/22/2016  Allergen Reaction Noted  . Lisinopril Cough 01/29/2016    Family History  Problem Relation Age of Onset  . Breast cancer Paternal Aunt 36    Social History   Social History  . Marital status: Single    Spouse name: N/A  . Number of children: N/A  . Years of education: N/A   Occupational History  . Not on file.   Social History Main Topics  . Smoking status: Never Smoker  . Smokeless tobacco: Never Used  . Alcohol use No  . Drug use: No  . Sexual activity: Not on file   Other Topics Concern  . Not on file   Social History Narrative  . No narrative on file    Review of Systems: See HPI, otherwise negative ROS  Physical Exam: BP (!) 142/80   Pulse 70   Temp 98.6 F (37 C) (Tympanic)   Resp 20   Ht 5' (1.524 m)   Wt (!) 139.7 kg (308 lb)   SpO2 100%   BMI 60.15 kg/m  General:   Alert,  pleasant and cooperative in NAD Head:  Normocephalic and atraumatic. Neck:  Supple; no masses or thyromegaly. Lungs:  Clear throughout to auscultation.    Heart:  Regular rate and rhythm. Abdomen:  Soft, nontender and nondistended. Normal bowel sounds, without guarding, and without rebound.   Neurologic:  Alert and  oriented x4;  grossly normal neurologically.  Impression/Plan: Yolanda Stephens is here for an colonoscopy to be performed for FH colon polyps in sister, screening.  Risks, benefits, limitations, and alternatives regarding  colonoscopy have been reviewed with the patient.  Questions have been answered.  All parties agreeable.   Gaylyn Cheers, MD  08/19/2016, 4:03 PM

## 2016-08-19 NOTE — Anesthesia Preprocedure Evaluation (Signed)
Anesthesia Evaluation  Patient identified by MRN, date of birth, ID band Patient awake    Reviewed: Allergy & Precautions, NPO status , Patient's Chart, lab work & pertinent test results  History of Anesthesia Complications Negative for: history of anesthetic complications  Airway Mallampati: III       Dental   Pulmonary sleep apnea and Continuous Positive Airway Pressure Ventilation ,           Cardiovascular hypertension, Pt. on medications + Peripheral Vascular Disease       Neuro/Psych    GI/Hepatic Neg liver ROS, hiatal hernia, GERD  Medicated,  Endo/Other  Hypothyroidism   Renal/GU negative Renal ROS     Musculoskeletal   Abdominal   Peds  Hematology  (+) anemia ,   Anesthesia Other Findings   Reproductive/Obstetrics                             Anesthesia Physical Anesthesia Plan  ASA: II  Anesthesia Plan: General   Post-op Pain Management:    Induction: Intravenous  Airway Management Planned: Nasal Cannula  Additional Equipment:   Intra-op Plan:   Post-operative Plan:   Informed Consent: I have reviewed the patients History and Physical, chart, labs and discussed the procedure including the risks, benefits and alternatives for the proposed anesthesia with the patient or authorized representative who has indicated his/her understanding and acceptance.     Plan Discussed with:   Anesthesia Plan Comments:         Anesthesia Quick Evaluation

## 2016-08-22 ENCOUNTER — Encounter: Payer: Self-pay | Admitting: Unknown Physician Specialty

## 2016-08-25 NOTE — Anesthesia Postprocedure Evaluation (Signed)
Anesthesia Post Note  Patient: Temica Nath  Procedure(s) Performed: Procedure(s) (LRB): COLONOSCOPY (N/A)  Patient location during evaluation: PACU Anesthesia Type: General Level of consciousness: awake and alert Pain management: pain level controlled Vital Signs Assessment: post-procedure vital signs reviewed and stable Respiratory status: spontaneous breathing, nonlabored ventilation, respiratory function stable and patient connected to nasal cannula oxygen Cardiovascular status: blood pressure returned to baseline and stable Postop Assessment: no signs of nausea or vomiting Anesthetic complications: no    Last Vitals:  Vitals:   08/19/16 1655 08/19/16 1705  BP: 111/68 122/71  Pulse: 65 63  Resp: 18 (!) 26  Temp:      Last Pain:  Vitals:   08/19/16 1635  TempSrc: Tympanic                 Molli Barrows

## 2017-07-10 ENCOUNTER — Other Ambulatory Visit: Payer: Self-pay | Admitting: Family Medicine

## 2017-07-10 DIAGNOSIS — Z1231 Encounter for screening mammogram for malignant neoplasm of breast: Secondary | ICD-10-CM

## 2017-08-02 ENCOUNTER — Ambulatory Visit
Admission: RE | Admit: 2017-08-02 | Discharge: 2017-08-02 | Disposition: A | Discharge status: Home / Self Care | Payer: PRIVATE HEALTH INSURANCE | Source: Ambulatory Visit | Attending: Family Medicine | Admitting: Family Medicine

## 2017-08-02 DIAGNOSIS — Z1231 Encounter for screening mammogram for malignant neoplasm of breast: Secondary | ICD-10-CM | POA: Diagnosis present

## 2017-11-30 ENCOUNTER — Encounter: Payer: Self-pay | Admitting: Certified Nurse Midwife

## 2017-11-30 ENCOUNTER — Ambulatory Visit (INDEPENDENT_AMBULATORY_CARE_PROVIDER_SITE_OTHER): Payer: PRIVATE HEALTH INSURANCE | Admitting: Certified Nurse Midwife

## 2017-11-30 VITALS — BP 128/80 | HR 69 | Ht 62.0 in | Wt 310.0 lb

## 2017-11-30 DIAGNOSIS — Z01411 Encounter for gynecological examination (general) (routine) with abnormal findings: Secondary | ICD-10-CM

## 2017-11-30 DIAGNOSIS — R599 Enlarged lymph nodes, unspecified: Secondary | ICD-10-CM | POA: Diagnosis not present

## 2017-11-30 DIAGNOSIS — R1907 Generalized intra-abdominal and pelvic swelling, mass and lump: Secondary | ICD-10-CM

## 2017-11-30 DIAGNOSIS — B9689 Other specified bacterial agents as the cause of diseases classified elsewhere: Secondary | ICD-10-CM

## 2017-11-30 DIAGNOSIS — L292 Pruritus vulvae: Secondary | ICD-10-CM

## 2017-11-30 DIAGNOSIS — R590 Localized enlarged lymph nodes: Secondary | ICD-10-CM

## 2017-11-30 DIAGNOSIS — N852 Hypertrophy of uterus: Secondary | ICD-10-CM

## 2017-11-30 DIAGNOSIS — N760 Acute vaginitis: Secondary | ICD-10-CM | POA: Diagnosis not present

## 2017-11-30 DIAGNOSIS — Z124 Encounter for screening for malignant neoplasm of cervix: Secondary | ICD-10-CM

## 2017-11-30 DIAGNOSIS — D472 Monoclonal gammopathy: Secondary | ICD-10-CM | POA: Insufficient documentation

## 2017-11-30 LAB — POCT WET PREP (WET MOUNT): Trichomonas Wet Prep HPF POC: ABSENT

## 2017-11-30 MED ORDER — SECNIDAZOLE 2 G PO PACK: 1.0000 | PACK | Freq: Once | ORAL | 0 refills | Status: AC

## 2017-11-30 NOTE — Progress Notes (Signed)
Gynecology Annual Exam  PCP: [facility]  Chief Complaint:  Chief Complaint  Patient presents with  . Gynecologic Exam    History of Present Illness:Yolanda Stephens is a 57 year old African American/Black female, G0 P0000, who presents for her annual exam. She has several concerns: 1) Thinks she has a yeast infection-itching, no discharge intermittently for 3-4 weeks. 2) Pain in her left groin and a lump she noticed last month, then it resolved and now she feels it again.   Her menses are absent and she is postmenopausal.  Hot flashes are minimal She reports she has had no spotting. (Review of record reveals she had spotting in April 2017 and she had an endometrial biopsy at that time which was B9/ inactive endometrium).   The patient's past medical history is remarkable for hypertension, morbid obesity, OSA, and uterine fibroids. Since her last annual GYN exam at [facility] dated 10/01/2014, she has been diagnosed with monoclonal gammopathy of undetermined significance.   She is not sexually active.  Her most recent pap smear was obtained 01/27/2016 at [facility]  and was with negative cells and negative HPV DNA.  Her most recent mammogram obtained on 08/02/2017 was normal. Her PCP ordered her mammogram..  There is no family history of breast cancer.  There is no family history of ovarian cancer Her last colonoscopy was 08/19/2016 and was normal.  The patient does do monthly self breast exams.  The patient does not smoke.  The patient does not drink alcohol.  The patient does not use illegal drugs.  The patient does exercise. Since November, she has been working with a trainer 3x/week x 1 hour. Her BMI is 56.70 kg/m2 The patient does get adequate calcium in her diet.  She had a recent cholesterol screen in 2018 that was normal.     The patient denies current symptoms of depression.    Review of Systems: Review of Systems  Constitutional: Positive for chills. Negative for  fever and weight loss.       Positive for weight gain  HENT: Negative for congestion, sinus pain and sore throat.   Eyes: Positive for blurred vision. Negative for pain.       Positive for eye irritation  Respiratory: Negative for hemoptysis, shortness of breath and wheezing.   Cardiovascular: Negative for chest pain, palpitations and leg swelling.  Gastrointestinal: Negative for abdominal pain, blood in stool, diarrhea, heartburn, nausea and vomiting.  Genitourinary: Negative for dysuria, frequency, hematuria and urgency.  Musculoskeletal: Negative for back pain, joint pain and myalgias.       Positive for joint swelling  Skin: Negative for itching and rash.  Neurological: Negative for dizziness, tingling and headaches.  Endo/Heme/Allergies: Positive for environmental allergies (hayfever). Negative for polydipsia. Does not bruise/bleed easily.       Negative for hirsutism   Psychiatric/Behavioral: Negative for depression. The patient is not nervous/anxious and does not have insomnia.     Past Medical History:  Past Medical History:  Diagnosis Date  . Asthma   . Bradycardia   . Chest pain   . Fibroids   . GERD (gastroesophageal reflux disease)   . Heart palpitations   . Hiatal hernia   . Hypertension   . Hypothyroidism   . IBS (irritable bowel syndrome)   . Migraines   . Monoclonal gammopathy   . Obesity   . Sleep apnea   . Varicose veins of both lower extremities     Past  Surgical History:  Past Surgical History:  Procedure Laterality Date  . COLONOSCOPY  2011  . COLONOSCOPY N/A 08/19/2016   Procedure: COLONOSCOPY;  Surgeon: Manya Silvas, MD;  Location: [facility] ENDOSCOPY;  Service: Endoscopy;  Laterality: N/A;  . ESOPHAGOGASTRODUODENOSCOPY      Family History:  Family History  Problem Relation Age of Onset  . Breast cancer Paternal Aunt 2       paternal great aunt  . Diabetes Mother   . Hypertension Mother   . Osteoarthritis Mother   . Deep vein thrombosis  Mother   . Diabetes Father   . Hypertension Father   . Hyperlipidemia Father   . Peripheral vascular disease Father   . Deep vein thrombosis Father   . Prostate cancer Father   . Hypertension Sister   . Depression Sister   . Deep vein thrombosis Sister   . Epilepsy Brother   . Colon cancer Maternal Grandmother   . Diabetes Maternal Grandmother   . Diabetes Maternal Grandfather   . Hyperlipidemia Maternal Grandfather   . Diabetes Paternal Grandmother   . Mental retardation Brother   . Mental retardation Brother   . Hypertension Brother   . Diabetes Brother   . Peripheral vascular disease Brother   . Diabetes Maternal Aunt     Social History:  Social History   Socioeconomic History  . Marital status: Single    Spouse name: Not on file  . Number of children: 0  . Years of education: Not on file  . Highest education level: Not on file  Social Needs  . Financial resource strain: Not on file  . Food insecurity - worry: Not on file  . Food insecurity - inability: Not on file  . Transportation needs - medical: Not on file  . Transportation needs - non-medical: Not on file  Occupational History  . Occupation: Health visitor  Tobacco Use  . Smoking status: Never Smoker  . Smokeless tobacco: Never Used  Substance and Sexual Activity  . Alcohol use: No  . Drug use: No  . Sexual activity: Not Currently    Birth control/protection: Post-menopausal  Other Topics Concern  . Not on file  Social History Narrative  . Not on file    Allergies:  Allergies  Allergen Reactions  . Lisinopril Cough    Medications:  Current Outpatient Medications on File Prior to Visit  Medication Sig Dispense Refill  . ibuprofen (ADVIL,MOTRIN) 800 MG tablet Take 1 tablet (800 mg total) by mouth 3 (three) times daily. (Patient taking differently: Take 800 mg by mouth every 6 (six) hours as needed. ) 21 tablet 0  . triamterene-hydrochlorothiazide (DYAZIDE) 37.5-25 MG capsule 1 capsule daily.       No current facility-administered medications on file prior to visit.   and Zyrtec 10 mgm daily prn   Physical Exam Vitals: BP 128/80   Pulse 69   Ht 5\' 2"  (1.575 m)   Wt (!) 310 lb (140.6 kg)   BMI 56.70 kg/m   General: obese AAF in NAD HEENT: normocephalic, anicteric Neck: no thyroid enlargement, no palpable nodules, no cervical lymphadenopathy  Pulmonary: No increased work of breathing, CTAB Cardiovascular: RRR, with a Grade III/VI systolic murmur best heard at the base.  Breast: Breast symmetrical, no tenderness, no palpable nodules or masses, no skin or nipple retraction present, no nipple discharge.  No axillary, infraclavicular or supraclavicular lymphadenopathy. Abdomen: Soft, non-tender, obese with large pannus.  Umbilicus without lesions.  No hepatomegaly or masses palpable.  Tender 2cm lymph node in left groin Genitourinary:  External: Normal external female genitalia.  Normal urethral meatus, normal  Bartholin's and Skene's glands.    Vagina: Normal vaginal mucosa, no evidence of prolapse, white homogenous discharge.    Cervix: Grossly normal in appearance, no bleeding, non-tender, nullip  Uterus: Anteflexed, dextrorotated, enlarged, NT  Adnexa: No adnexal masses, non-tender  Rectal: deferred  Lymphatic: no evidence of inguinal lymphadenopathy Extremities: +1edema, erythema, or tenderness Neurologic: Grossly intact Psychiatric: mood appropriate, affect full  Results for orders placed or performed in visit on 11/30/17 (from the past 24 hour(s))  POCT Wet Prep [facility] [place])     Status: Abnormal   Collection Time: 11/30/17 10:16 PM  Result Value Ref Range   Source Wet Prep POC vagina    WBC, Wet Prep HPF POC     Bacteria Wet Prep HPF POC  Few   BACTERIA WET PREP MORPHOLOGY POC     Clue Cells Wet Prep HPF POC Many (A) None   Clue Cells Wet Prep Whiff POC     Yeast Wet Prep HPF POC None    KOH Wet Prep POC     Trichomonas Wet Prep HPF POC Absent Absent      Assessment: 57 y.o. annual gyn  In the past History of fibroids, but I have not been able to palpate them in the past-possible growth of the fibroids +Lymphadenopathy, left inguinal  (2 cm) Morbid obesity Bacterial vaginosis  Plan:   1) Breast cancer screening - recommend monthly self breast exam and annual screening mammograms. Mammogram is up to date.  2) Consulted DR Kenton Kingfisher regarding findings on physical exam. He recommends CT scan on abdomen and pelvis.  3) Cervical cancer screening - Pap was done.   4) Solosec 2 GM x 1 dose sent to pharmacy for treatment of BV. Explained how to take the medication.  5) Routine healthcare maintenance including cholesterol and diabetes screening managed by PCP   6) Follow up pending CT results.  Dalia Heading, CNM

## 2017-12-05 LAB — IGP, APTIMA HPV
HPV Aptima: NEGATIVE
PAP SMEAR COMMENT: 0

## 2017-12-07 ENCOUNTER — Ambulatory Visit
Admission: RE | Admit: 2017-12-07 | Discharge: 2017-12-07 | Disposition: A | Discharge status: Home / Self Care | Payer: PRIVATE HEALTH INSURANCE | Source: Ambulatory Visit | Attending: Certified Nurse Midwife | Admitting: Certified Nurse Midwife

## 2017-12-07 DIAGNOSIS — R1907 Generalized intra-abdominal and pelvic swelling, mass and lump: Secondary | ICD-10-CM | POA: Insufficient documentation

## 2017-12-07 DIAGNOSIS — R590 Localized enlarged lymph nodes: Secondary | ICD-10-CM | POA: Diagnosis not present

## 2017-12-07 DIAGNOSIS — R599 Enlarged lymph nodes, unspecified: Secondary | ICD-10-CM

## 2017-12-07 DIAGNOSIS — R918 Other nonspecific abnormal finding of lung field: Secondary | ICD-10-CM | POA: Diagnosis not present

## 2017-12-07 DIAGNOSIS — D259 Leiomyoma of uterus, unspecified: Secondary | ICD-10-CM | POA: Insufficient documentation

## 2017-12-07 MED ORDER — IOPAMIDOL (ISOVUE-300) INJECTION 61%
125.0000 mL | Freq: Once | INTRAVENOUS | Status: AC | PRN
  Administered 2017-12-07: 150 mL via INTRAVENOUS

## 2017-12-11 ENCOUNTER — Telehealth: Payer: Self-pay | Admitting: Certified Nurse Midwife

## 2017-12-11 MED ORDER — TINIDAZOLE 500 MG PO TABS: 1000.0000 mg | ORAL_TABLET | Freq: Every day | ORAL | 0 refills | Status: AC

## 2017-12-11 NOTE — Telephone Encounter (Signed)
I was able to speak with Dr Bobette Mo at [facility] regarding the results of Yolanda Stephens's CT scan. She wants to see Yolanda Stephens in her office. I called Yolanda Stephens and left a message regarding scheduling an appointment with her. RX for Tindamax sent to pharmacy.  Yolanda Stephens, CNM

## 2017-12-11 NOTE — Telephone Encounter (Signed)
Patient called with results of CT scan. Fibroid size stable: there is a 3cm and 5cm fibroid. Abdominal/ pelvic lymphadenopathy is stable: ? Chronic reactive lymphadenopathy There are several ground glass opacities in the RLL. Radiologist recommends repeat CT of chest in 3-6 months. Advised patient will call her PCP in [facility] in [facility] and discuss results and follow up. 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 Patient has BV and the RX for Solosec was too expensive. Will need alternative called in. Dalia Heading, CNM

## 2017-12-11 NOTE — Telephone Encounter (Signed)
Pt aware pap results negative. Please contact pt regarding CT results. Thank you!  Pt cb# [phone]

## 2017-12-11 NOTE — Telephone Encounter (Signed)
Pt is calling for her results. Please advise

## 2018-05-06 DIAGNOSIS — R911 Solitary pulmonary nodule: Secondary | ICD-10-CM | POA: Insufficient documentation

## 2018-05-06 DIAGNOSIS — N289 Disorder of kidney and ureter, unspecified: Secondary | ICD-10-CM | POA: Insufficient documentation

## 2018-05-09 ENCOUNTER — Other Ambulatory Visit: Payer: Self-pay | Admitting: Family Medicine

## 2018-05-09 DIAGNOSIS — N289 Disorder of kidney and ureter, unspecified: Secondary | ICD-10-CM

## 2018-05-09 DIAGNOSIS — I10 Essential (primary) hypertension: Secondary | ICD-10-CM

## 2018-09-21 ENCOUNTER — Other Ambulatory Visit: Payer: Self-pay | Admitting: Family Medicine

## 2018-11-05 ENCOUNTER — Other Ambulatory Visit: Payer: Self-pay | Admitting: Family Medicine

## 2018-11-05 DIAGNOSIS — Z1231 Encounter for screening mammogram for malignant neoplasm of breast: Secondary | ICD-10-CM

## 2018-11-15 ENCOUNTER — Ambulatory Visit: Payer: PRIVATE HEALTH INSURANCE

## 2018-11-27 ENCOUNTER — Other Ambulatory Visit: Payer: Self-pay | Admitting: Family Medicine

## 2018-11-27 DIAGNOSIS — I10 Essential (primary) hypertension: Secondary | ICD-10-CM

## 2018-11-27 DIAGNOSIS — J849 Interstitial pulmonary disease, unspecified: Secondary | ICD-10-CM

## 2018-11-28 ENCOUNTER — Encounter (INDEPENDENT_AMBULATORY_CARE_PROVIDER_SITE_OTHER): Payer: Self-pay

## 2018-11-28 ENCOUNTER — Ambulatory Visit
Admission: RE | Admit: 2018-11-28 | Discharge: 2018-11-28 | Disposition: A | Discharge status: Home / Self Care | Payer: PRIVATE HEALTH INSURANCE | Source: Ambulatory Visit | Attending: Family Medicine | Admitting: Family Medicine

## 2018-11-28 DIAGNOSIS — Z1231 Encounter for screening mammogram for malignant neoplasm of breast: Secondary | ICD-10-CM | POA: Diagnosis present

## 2019-08-08 ENCOUNTER — Other Ambulatory Visit: Payer: Self-pay

## 2019-08-08 ENCOUNTER — Encounter: Payer: PRIVATE HEALTH INSURANCE | Attending: Family Medicine | Admitting: Dietician

## 2019-08-08 VITALS — Ht 61.0 in | Wt 312.4 lb

## 2019-08-08 DIAGNOSIS — Z6841 Body Mass Index (BMI) 40.0 and over, adult: Secondary | ICD-10-CM | POA: Diagnosis not present

## 2019-08-08 DIAGNOSIS — I10 Essential (primary) hypertension: Secondary | ICD-10-CM | POA: Insufficient documentation

## 2019-08-08 NOTE — Progress Notes (Signed)
Medical Nutrition Therapy: Visit start time: [id number]  end time: 1440  Assessment:  Diagnosis: obesity Past medical history: hypertension Psychosocial issues/ stress concerns: none  Preferred learning method:  . Auditory . Visual . Hands-on  Current weight: 312.4lbs Height: 5'1.25" Medications, supplements: reconciled list in medical record  Progress and evaluation:   Patient reports some weight fluctuation in recent months; she has worked on reducing unhealthy snacks ie sweets, trying to increase vegetables.  She reports BP is well controlled with medication.    No recent diets or weight loss programs; used lipozene supplement in 2016 and lost to 278lbs.   She seeks help in understanding caloric needs and healthy food choices.  Physical activity: cardio + weight training 30-45 minutes, 3x a week with personal trainer  Dietary Intake:  Usual eating pattern includes 2 meals and 3 snacks per day. Dining out frequency: 3 meals per week.  Breakfast: workdays 4am meat + green veg or starchy veg; oatmeal; meat and cheese roll-up; drinks coffee and water. If home -- egg sandwich about 4am Snack: same as pm Lunch: 11-11:30am meat + veg Snack: 1-1:30pm recently nuts occ with raisins or cranberries; popcorn; sometimes fruit Supper: often none due to sleeping Snack: same as pm Beverages: water (aims for 64oz+), some sweet tea (small, maybe 2-3 times a week), coffee with sugar and creamer daily in am  Nutrition Care Education: Topics covered:  Basic nutrition: basic food groups, appropriate nutrient balance, appropriate meal and snack schedule in particular for 3rd shift working hours    Weight control: importance of low sugar and low fat choices, appropriate food portions, estimated energy needs for weight loss at 1400-1600kcal, provided guidance for 45% CHO, 25% protein, and 30% fat; instructed on basic meal planning using plate method; discussed balanced meal options  Hypertension:  identifying high sodium foods   Nutritional Diagnosis:  [place]-3.3 Overweight/obesity As related to history of excess calories and inadequate physical activity.  As evidenced by patient with current BMI of 59, working on diet and lifestyle changes to promote ongoing weight loss.  Intervention:   Instruction and discussion as noted above.  Patient is working to make healthy food choices and is engaging in an active lifestyle.  Established nutrition goals with direction from patient.  Education Materials given:  . Plate Planner with food lists, sample meal pattern . Quick and Healthy Meal Ideas . Snacking handout . Goals/ instructions   Learner/ who was taught:  . Patient   Level of understanding: Marland Kitchen Verbalizes/ demonstrates competency   Demonstrated degree of understanding via:   Teach back Learning barriers: . None   Willingness to learn/ readiness for change: . Eager, change in progress   Monitoring and Evaluation:  Dietary intake, exercise, BP control, and body weight      follow up: 09/26/19 at 1:15pm

## 2019-08-08 NOTE — Patient Instructions (Signed)
   Great job making some healthy food choices!  Consider walking on days you are not working out with trainer.   Plan to eat something every 3-5 hours while awake.   Eat a snack or light meal regularly after sleeping/ before going to work at night.  Use menus and snack ideas provided for balanced nutrition and healthy portions.

## 2019-09-25 ENCOUNTER — Encounter: Payer: Self-pay | Admitting: Dietician

## 2019-09-25 NOTE — Progress Notes (Signed)
Patient rescheduled her MNT follow-up appointment from today to 10/24/19.

## 2019-09-26 ENCOUNTER — Ambulatory Visit: Payer: PRIVATE HEALTH INSURANCE | Admitting: Dietician

## 2019-10-24 ENCOUNTER — Ambulatory Visit: Payer: PRIVATE HEALTH INSURANCE | Admitting: Dietician

## 2019-12-11 ENCOUNTER — Encounter: Payer: Self-pay | Admitting: Dietician

## 2019-12-11 NOTE — Progress Notes (Signed)
Have not heard back from patient to reschedule her cancelled appointment from 10/24/19. Sent notification to referring provider.

## 2020-04-27 DIAGNOSIS — M5432 Sciatica, left side: Secondary | ICD-10-CM | POA: Diagnosis not present

## 2020-04-27 DIAGNOSIS — Z6841 Body Mass Index (BMI) 40.0 and over, adult: Secondary | ICD-10-CM | POA: Diagnosis not present

## 2020-04-27 DIAGNOSIS — L309 Dermatitis, unspecified: Secondary | ICD-10-CM | POA: Diagnosis not present

## 2020-04-30 ENCOUNTER — Other Ambulatory Visit: Payer: Self-pay | Admitting: Family Medicine

## 2020-04-30 DIAGNOSIS — Z1231 Encounter for screening mammogram for malignant neoplasm of breast: Secondary | ICD-10-CM

## 2020-05-14 ENCOUNTER — Encounter (INDEPENDENT_AMBULATORY_CARE_PROVIDER_SITE_OTHER): Payer: Self-pay

## 2020-05-14 ENCOUNTER — Ambulatory Visit
Admission: RE | Admit: 2020-05-14 | Discharge: 2020-05-14 | Disposition: A | Discharge status: Home / Self Care | Payer: BC Managed Care – PPO | Source: Ambulatory Visit | Attending: Family Medicine | Admitting: Family Medicine

## 2020-05-14 ENCOUNTER — Other Ambulatory Visit: Payer: Self-pay

## 2020-05-14 DIAGNOSIS — Z1231 Encounter for screening mammogram for malignant neoplasm of breast: Secondary | ICD-10-CM | POA: Diagnosis not present

## 2020-05-26 NOTE — Progress Notes (Signed)
Gynecology Annual Exam  PCP: [facility]  Chief Complaint:  Chief Complaint  Patient presents with  . Gynecologic Exam    History of Present Illness:Yolanda Stephens is a 59 year old African American/Black female, G0 P0000, who presents for her annual exam.    Her menses are absent and she is postmenopausal.  Hot flashes are minimal She reports she has had no spotting. Hx of  postmenopausal spotting in April 2017 and an endometrial biopsy at that time which was B9/ inactive endometrium).   The patient's past medical history is remarkable for hypertension, morbid obesity, OSA, monoclonal gammopathy and uterine fibroids. Since her last annual GYN exam at [facility] dated 11/30/2017, she has been followed for ground glass opacities noted on a CT scan in 2019 that was done to evaluate left inguinal lymphadenopathy. Follow up CT in 2020 demonstrated resolution of the opacities and also revealed a 4 mm lesion in the LLL (probably B9). Note was also made of 5 cm and 3 cm calcified fibroids. She did receive her Moderna vaccines earlier this year.    She is not sexually active.  Her most recent pap smear was obtained 11/30/2017 and was with negative cells and negative HPV DNA.  Her most recent mammogram obtained on 05/14/2020 was normal. Her PCP ordered her mammogram..  There is no family history of breast cancer.  There is no family history of ovarian cancer Her last colonoscopy was 08/19/2016 and was normal.  The patient does do monthly self breast exams.  The patient does not smoke.  The patient does not drink alcohol.  The patient does not use illegal drugs.  The patient does exercise. She has been working with a trainer 3x/week x 1 hour. Her BMI is 58.15 kg/m2 The patient does get adequate calcium in her diet.  She had a recent cholesterol screen in 2021 that was normal.     The patient denies current symptoms of depression.    Review of Systems: Review of Systems    Constitutional: Negative for chills, fever and weight loss.       Positive for weight gain  HENT: Negative for congestion, sinus pain and sore throat.   Eyes: Positive for blurred vision. Negative for pain.       Positive for eye irritation  Respiratory: Positive for shortness of breath. Negative for hemoptysis and wheezing.   Cardiovascular: Negative for chest pain, palpitations and leg swelling.  Gastrointestinal: Negative for abdominal pain, blood in stool, diarrhea, heartburn, nausea and vomiting.  Genitourinary: Negative for dysuria, frequency, hematuria and urgency.  Musculoskeletal: Negative for back pain, joint pain and myalgias.       Positive for joint swelling  Skin: Negative for itching and rash.  Neurological: Negative for dizziness, tingling and headaches.  Endo/Heme/Allergies: Negative for environmental allergies and polydipsia. Does not bruise/bleed easily.       Negative for hirsutism   Psychiatric/Behavioral: Negative for depression. The patient is not nervous/anxious and does not have insomnia.     Past Medical History:  Past Medical History:  Diagnosis Date  . Bradycardia   . Chest pain   . Fibroids   . GERD (gastroesophageal reflux disease)   . Heart palpitations   . Hiatal hernia   . Hypertension   . Hypothyroidism   . IBS (irritable bowel syndrome)   . Migraines   . Monoclonal gammopathy   . Obesity   . Sleep apnea   . Varicose veins of  both lower extremities     Past Surgical History:  Past Surgical History:  Procedure Laterality Date  . COLONOSCOPY  2011  . COLONOSCOPY N/A 08/19/2016   Procedure: COLONOSCOPY;  Surgeon: Manya Silvas, MD;  Location: [facility] ENDOSCOPY;  Service: Endoscopy;  Laterality: N/A;  . ESOPHAGOGASTRODUODENOSCOPY      Family History:  Family History  Problem Relation Age of Onset  . Breast cancer Paternal Aunt 37       paternal great aunt  . Diabetes Mother   . Hypertension Mother   . Osteoarthritis Mother   .  Deep vein thrombosis Mother   . Diabetes Father   . Hypertension Father   . Hyperlipidemia Father   . Peripheral vascular disease Father   . Deep vein thrombosis Father   . Prostate cancer Father   . Hypertension Sister   . Depression Sister   . Deep vein thrombosis Sister   . Epilepsy Brother   . Colon cancer Maternal Grandmother   . Diabetes Maternal Grandmother   . Diabetes Maternal Grandfather   . Hyperlipidemia Maternal Grandfather   . Diabetes Paternal Grandmother   . Mental retardation Brother   . Mental retardation Brother   . Hypertension Brother   . Diabetes Brother   . Peripheral vascular disease Brother   . Diabetes Maternal Aunt     Social History:  Social History   Socioeconomic History  . Marital status: Single    Spouse name: Not on file  . Number of children: 0  . Years of education: Not on file  . Highest education level: Not on file  Occupational History  . Occupation: Health visitor  Tobacco Use  . Smoking status: Never Smoker  . Smokeless tobacco: Never Used  Vaping Use  . Vaping Use: Never used  Substance and Sexual Activity  . Alcohol use: No  . Drug use: No  . Sexual activity: Not Currently    Birth control/protection: Post-menopausal  Other Topics Concern  . Not on file  Social History Narrative  . Not on file   Social Determinants of Health   Financial Resource Strain:   . Difficulty of Paying Living Expenses: Not on file  Food Insecurity:   . Worried About Charity fundraiser in the Last Year: Not on file  . Ran Out of Food in the Last Year: Not on file  Transportation Needs:   . Lack of Transportation (Medical): Not on file  . Lack of Transportation (Non-Medical): Not on file  Physical Activity:   . Days of Exercise per Week: Not on file  . Minutes of Exercise per Session: Not on file  Stress:   . Feeling of Stress : Not on file  Social Connections:   . Frequency of Communication with Friends and Family: Not on file  .  Frequency of Social Gatherings with Friends and Family: Not on file  . Attends Religious Services: Not on file  . Active Member of Clubs or Organizations: Not on file  . Attends Archivist Meetings: Not on file  . Marital Status: Not on file  Intimate Partner Violence:   . Fear of Current or Ex-Partner: Not on file  . Emotionally Abused: Not on file  . Physically Abused: Not on file  . Sexually Abused: Not on file    Allergies:  Allergies  Allergen Reactions  . Lisinopril Cough    Medications:  Current Outpatient Medications:  .  aspirin EC 81 MG tablet, Take 81 mg by  mouth daily., Disp: , Rfl:  .  CINNAMON PO, Take by mouth., Disp: , Rfl:  .  Multiple Vitamin (MULTIVITAMIN) tablet, Take 1 tablet by mouth daily., Disp: , Rfl:  .  Multiple Vitamins-Minerals (ONE DAILY CALCIUM/IRON) TABS, Take 1 tablet by mouth daily., Disp: , Rfl:  .  OVER THE COUNTER MEDICATION, Take 1 tablet by mouth daily., Disp: , Rfl:  .  triamcinolone ointment (KENALOG) 0.1 %, Apply 1 application topically 2 (two) times daily., Disp: , Rfl:  .  triamterene-hydrochlorothiazide (DYAZIDE) 37.5-25 MG capsule, 1 capsule daily. , Disp: , Rfl:  .  TURMERIC CURCUMIN PO, Take by mouth., Disp: , Rfl:  .  ibuprofen (ADVIL,MOTRIN) 800 MG tablet, Take 1 tablet (800 mg total) by mouth 3 (three) times daily. (Patient not taking: Reported on 08/08/2019), Disp: 21 tablet, Rfl: 0   Physical Exam Vitals: BP 130/75   Pulse 75   Ht 5\' 2"  (1.575 m)   Wt (!) 318 lb (144.2 kg)   BMI 58.16 kg/m   General: obese AAF in NAD HEENT: normocephalic, anicteric Neck: no thyroid enlargement, no palpable nodules, no cervical lymphadenopathy  Pulmonary: No increased work of breathing, CTAB Cardiovascular: RRR, with a Grade III/VI systolic murmur best heard at the base.  Breast: Breast symmetrical, no tenderness, no palpable nodules or masses, no skin or nipple retraction present, no nipple discharge.  No axillary,  infraclavicular or supraclavicular lymphadenopathy. Abdomen: Soft, non-tender, obese with large pannus.  Umbilicus without lesions.  No hepatomegaly or masses palpable. No inguinal LAN palpated Genitourinary:  External: Normal external female genitalia.  Normal urethral meatus, normal Bartholin's and Skene's glands.    Vagina: Normal vaginal mucosa, no evidence of prolapse, white homogenous discharge.    Cervix: Grossly normal in appearance, no bleeding, non-tender, nullip  Uterus: Anteflexed, dextrorotated, enlarged-with questionable 8 cm mass off the right side of uterus vs large adnexal mass, NT. Difficult exam due to body habitus.  Adnexa: possible right adnexal mass, non-tender; no masses in left adnexa, NT  Rectal: deferred  Lymphatic: no evidence of inguinal lymphadenopathy Extremities: +1edema, erythema, or tenderness Neurologic: Grossly intact Psychiatric: mood appropriate, affect full    Assessment: 59 y.o. annual gyn exam History of fibroids, but I have not been able to palpate them in the past-possible growth of the fibroids vs adnexal mas Morbid obesity  Plan:   1) Breast cancer screening - recommend monthly self breast exam and annual screening mammograms. Mammogram is up to date.  2) Pelvic ultrasound and follow up with Dr Gilman Schmidt.  3) Cervical cancer screening - Pap was not done. Pap smear due in 1 year (every 3 years)  4) Colon cancer screening: UTD  5) Routine healthcare maintenance including cholesterol and diabetes screening managed by PCP    Dalia Heading, CNM

## 2020-05-27 ENCOUNTER — Other Ambulatory Visit: Payer: Self-pay

## 2020-05-27 ENCOUNTER — Encounter: Payer: Self-pay | Admitting: Certified Nurse Midwife

## 2020-05-27 ENCOUNTER — Ambulatory Visit (INDEPENDENT_AMBULATORY_CARE_PROVIDER_SITE_OTHER): Payer: BC Managed Care – PPO | Admitting: Certified Nurse Midwife

## 2020-05-27 VITALS — BP 130/75 | HR 75 | Ht 62.0 in | Wt 318.0 lb

## 2020-05-27 DIAGNOSIS — D219 Benign neoplasm of connective and other soft tissue, unspecified: Secondary | ICD-10-CM

## 2020-05-27 DIAGNOSIS — Z01419 Encounter for gynecological examination (general) (routine) without abnormal findings: Secondary | ICD-10-CM | POA: Diagnosis not present

## 2020-05-27 DIAGNOSIS — N9489 Other specified conditions associated with female genital organs and menstrual cycle: Secondary | ICD-10-CM | POA: Diagnosis not present

## 2020-06-11 ENCOUNTER — Ambulatory Visit: Payer: BC Managed Care – PPO

## 2020-06-11 ENCOUNTER — Ambulatory Visit: Payer: BC Managed Care – PPO | Admitting: Obstetrics and Gynecology

## 2020-06-11 DIAGNOSIS — N9489 Other specified conditions associated with female genital organs and menstrual cycle: Secondary | ICD-10-CM

## 2020-06-30 ENCOUNTER — Encounter: Payer: Self-pay | Admitting: Obstetrics and Gynecology

## 2020-06-30 ENCOUNTER — Ambulatory Visit (INDEPENDENT_AMBULATORY_CARE_PROVIDER_SITE_OTHER): Payer: BC Managed Care – PPO | Admitting: Obstetrics and Gynecology

## 2020-06-30 ENCOUNTER — Other Ambulatory Visit: Payer: Self-pay

## 2020-06-30 ENCOUNTER — Other Ambulatory Visit: Payer: Self-pay | Admitting: Obstetrics and Gynecology

## 2020-06-30 ENCOUNTER — Ambulatory Visit (INDEPENDENT_AMBULATORY_CARE_PROVIDER_SITE_OTHER): Payer: BC Managed Care – PPO

## 2020-06-30 ENCOUNTER — Other Ambulatory Visit: Payer: Self-pay | Admitting: Certified Nurse Midwife

## 2020-06-30 VITALS — BP 130/70 | Ht 62.0 in | Wt 319.6 lb

## 2020-06-30 DIAGNOSIS — N9489 Other specified conditions associated with female genital organs and menstrual cycle: Secondary | ICD-10-CM

## 2020-06-30 NOTE — Progress Notes (Signed)
Patient ID: Yolanda Stephens, female   DOB: 1961/04/27, 59 y.o.   MRN: [id number]  Reason for Consult: Gynecologic Exam (pelvic U/S)   Referred by Shari Prows, [facility]  Subjective:     HPI:  Yolanda Stephens is a 59 y.o. female   She was seen previously by Dalia Heading on 05/27/2020. An adnexal mass was palpated on exam. She denies bloating, pelvic pressure, changes in appetite, changes to BM, nausea and vomiting. She is following up today for a pelvic US.    Past Medical History:  Diagnosis Date  . Bradycardia   . Chest pain   . Fibroids   . GERD (gastroesophageal reflux disease)   . Heart palpitations   . Hiatal hernia   . Hypertension   . Hypothyroidism   . IBS (irritable bowel syndrome)   . Migraines   . Monoclonal gammopathy   . Obesity   . Sleep apnea   . Varicose veins of both lower extremities    Family History  Problem Relation Age of Onset  . Breast cancer Paternal Aunt 40       paternal great aunt  . Diabetes Mother   . Hypertension Mother   . Osteoarthritis Mother   . Deep vein thrombosis Mother   . Diabetes Father   . Hypertension Father   . Hyperlipidemia Father   . Peripheral vascular disease Father   . Deep vein thrombosis Father   . Prostate cancer Father   . Hypertension Sister   . Depression Sister   . Deep vein thrombosis Sister   . Epilepsy Brother   . Colon cancer Maternal Grandmother   . Diabetes Maternal Grandmother   . Diabetes Maternal Grandfather   . Hyperlipidemia Maternal Grandfather   . Diabetes Paternal Grandmother   . Mental retardation Brother   . Mental retardation Brother   . Hypertension Brother   . Diabetes Brother   . Peripheral vascular disease Brother   . Diabetes Maternal Aunt    Past Surgical History:  Procedure Laterality Date  . COLONOSCOPY  2011  . COLONOSCOPY N/A 08/19/2016   Procedure: COLONOSCOPY;  Surgeon: Manya Silvas, MD;  Location: [facility] ENDOSCOPY;  Service: Endoscopy;  Laterality:  N/A;  . ESOPHAGOGASTRODUODENOSCOPY      Short Social History:  Social History   Tobacco Use  . Smoking status: Never Smoker  . Smokeless tobacco: Never Used  Substance Use Topics  . Alcohol use: No    Allergies  Allergen Reactions  . Lisinopril Cough    Current Outpatient Medications  Medication Sig Dispense Refill  . aspirin EC 81 MG tablet Take 81 mg by mouth daily.    Marland Kitchen CINNAMON PO Take by mouth.    Marland Kitchen ibuprofen (ADVIL,MOTRIN) 800 MG tablet Take 1 tablet (800 mg total) by mouth 3 (three) times daily. 21 tablet 0  . Multiple Vitamin (MULTIVITAMIN) tablet Take 1 tablet by mouth daily.    . Multiple Vitamins-Minerals (ONE DAILY CALCIUM/IRON) TABS Take 1 tablet by mouth daily.    Marland Kitchen OVER THE COUNTER MEDICATION Take 1 tablet by mouth daily.    Marland Kitchen triamcinolone ointment (KENALOG) 0.1 % Apply 1 application topically 2 (two) times daily.    Marland Kitchen triamterene-hydrochlorothiazide (DYAZIDE) 37.5-25 MG capsule 1 capsule daily.     . TURMERIC CURCUMIN PO Take by mouth.     No current facility-administered medications for this visit.    Review of Systems  Constitutional: Negative for chills, fatigue, fever and unexpected weight change.  HENT: Negative for trouble swallowing.  Eyes: Negative for loss of vision.  Respiratory: Negative for cough, shortness of breath and wheezing.  Cardiovascular: Negative for chest pain, leg swelling, palpitations and syncope.  GI: Negative for abdominal pain, blood in stool, diarrhea, nausea and vomiting.  GU: Negative for difficulty urinating, dysuria, frequency and hematuria.  Musculoskeletal: Negative for back pain, leg pain and joint pain.  Skin: Negative for rash.  Neurological: Negative for dizziness, headaches, light-headedness, numbness and seizures.  Psychiatric: Negative for behavioral problem, confusion, depressed mood and sleep disturbance.        Objective:  Objective   Vitals:   06/30/20 1616  BP: 130/70  Weight: (!) 319 lb 9.6 oz  (145 kg)  Height: 5\' 2"  (1.575 m)   Body mass index is 58.46 kg/m.  Physical Exam Vitals and nursing note reviewed.  Constitutional:      Appearance: She is well-developed.  HENT:     Head: Normocephalic and atraumatic.  Eyes:     Pupils: Pupils are equal, round, and reactive to light.  Cardiovascular:     Rate and Rhythm: Normal rate and regular rhythm.  Pulmonary:     Effort: Pulmonary effort is normal. No respiratory distress.  Skin:    General: Skin is warm and dry.  Neurological:     Mental Status: She is alert and oriented to person, place, and time.  Psychiatric:        Behavior: Behavior normal.        Thought Content: Thought content normal.        Judgment: Judgment normal.         Assessment/Plan:     59 yo with palpated right adnexal mass  Could not see right ovary on pelvic US today. Adnexal mass was felt on the right previously. Discussed options to repeat pelvic exam, assume that the ovary was atrophic and hence not seen, or to order an abdomen/pelvic CT. Patient would like to have further imaging with CT scan. Declines repeat pelvic exam today by myself. Body habitus limits exam and pelvic US.   More than 10 minutes were spent face to face with the patient in the room, reviewing the medical record, labs and images, and coordinating care for the patient. The plan of management was discussed in detail and counseling was provided.        Adrian Prows MD [facility], [facility] 07/05/2020 7:59 PM

## 2020-07-05 ENCOUNTER — Encounter: Payer: Self-pay | Admitting: Obstetrics and Gynecology

## 2020-07-15 ENCOUNTER — Other Ambulatory Visit: Payer: Self-pay | Admitting: Obstetrics and Gynecology

## 2020-07-15 DIAGNOSIS — N9489 Other specified conditions associated with female genital organs and menstrual cycle: Secondary | ICD-10-CM

## 2020-07-22 ENCOUNTER — Ambulatory Visit: Payer: BC Managed Care – PPO

## 2020-07-29 ENCOUNTER — Ambulatory Visit
Admission: RE | Admit: 2020-07-29 | Discharge: 2020-07-29 | Disposition: A | Discharge status: Home / Self Care | Payer: BC Managed Care – PPO | Source: Ambulatory Visit | Attending: Obstetrics and Gynecology | Admitting: Obstetrics and Gynecology

## 2020-07-29 ENCOUNTER — Other Ambulatory Visit: Payer: Self-pay

## 2020-07-29 DIAGNOSIS — Z8742 Personal history of other diseases of the female genital tract: Secondary | ICD-10-CM | POA: Diagnosis not present

## 2020-07-29 DIAGNOSIS — N9489 Other specified conditions associated with female genital organs and menstrual cycle: Secondary | ICD-10-CM

## 2020-07-29 DIAGNOSIS — D259 Leiomyoma of uterus, unspecified: Secondary | ICD-10-CM | POA: Diagnosis not present

## 2020-07-29 MED ORDER — IOPAMIDOL (ISOVUE-300) INJECTION 61%
125.0000 mL | Freq: Once | INTRAVENOUS | Status: AC | PRN
  Administered 2020-07-29: 125 mL via INTRAVENOUS

## 2020-09-22 DIAGNOSIS — Z0000 Encounter for general adult medical examination without abnormal findings: Secondary | ICD-10-CM | POA: Diagnosis not present

## 2020-09-22 DIAGNOSIS — G4730 Sleep apnea, unspecified: Secondary | ICD-10-CM | POA: Diagnosis not present

## 2020-09-22 DIAGNOSIS — I890 Lymphedema, not elsewhere classified: Secondary | ICD-10-CM | POA: Diagnosis not present

## 2020-09-22 DIAGNOSIS — I10 Essential (primary) hypertension: Secondary | ICD-10-CM | POA: Diagnosis not present

## 2020-09-22 DIAGNOSIS — I8393 Asymptomatic varicose veins of bilateral lower extremities: Secondary | ICD-10-CM | POA: Diagnosis not present

## 2020-11-04 DIAGNOSIS — I10 Essential (primary) hypertension: Secondary | ICD-10-CM | POA: Diagnosis not present

## 2020-11-04 DIAGNOSIS — G4733 Obstructive sleep apnea (adult) (pediatric): Secondary | ICD-10-CM | POA: Diagnosis not present

## 2020-11-04 DIAGNOSIS — R0602 Shortness of breath: Secondary | ICD-10-CM | POA: Diagnosis not present

## 2020-11-23 DIAGNOSIS — R21 Rash and other nonspecific skin eruption: Secondary | ICD-10-CM | POA: Diagnosis not present

## 2020-12-17 DIAGNOSIS — R0602 Shortness of breath: Secondary | ICD-10-CM | POA: Diagnosis not present

## 2020-12-17 DIAGNOSIS — I350 Nonrheumatic aortic (valve) stenosis: Secondary | ICD-10-CM | POA: Diagnosis not present

## 2020-12-22 DIAGNOSIS — I10 Essential (primary) hypertension: Secondary | ICD-10-CM | POA: Diagnosis not present

## 2020-12-22 DIAGNOSIS — I350 Nonrheumatic aortic (valve) stenosis: Secondary | ICD-10-CM | POA: Diagnosis not present

## 2021-03-23 DIAGNOSIS — E049 Nontoxic goiter, unspecified: Secondary | ICD-10-CM | POA: Diagnosis not present

## 2021-03-23 DIAGNOSIS — I10 Essential (primary) hypertension: Secondary | ICD-10-CM | POA: Diagnosis not present

## 2021-03-23 DIAGNOSIS — R911 Solitary pulmonary nodule: Secondary | ICD-10-CM | POA: Diagnosis not present

## 2021-05-13 DIAGNOSIS — Z03818 Encounter for observation for suspected exposure to other biological agents ruled out: Secondary | ICD-10-CM | POA: Diagnosis not present

## 2021-05-13 DIAGNOSIS — Z20822 Contact with and (suspected) exposure to covid-19: Secondary | ICD-10-CM | POA: Diagnosis not present

## 2021-05-21 DIAGNOSIS — R918 Other nonspecific abnormal finding of lung field: Secondary | ICD-10-CM | POA: Diagnosis not present

## 2021-05-21 DIAGNOSIS — E049 Nontoxic goiter, unspecified: Secondary | ICD-10-CM | POA: Diagnosis not present

## 2021-05-21 DIAGNOSIS — R911 Solitary pulmonary nodule: Secondary | ICD-10-CM | POA: Diagnosis not present

## 2021-06-03 ENCOUNTER — Encounter: Payer: Self-pay | Admitting: Obstetrics and Gynecology

## 2021-06-03 ENCOUNTER — Ambulatory Visit (INDEPENDENT_AMBULATORY_CARE_PROVIDER_SITE_OTHER): Payer: BC Managed Care – PPO | Admitting: Obstetrics and Gynecology

## 2021-06-03 ENCOUNTER — Other Ambulatory Visit (HOSPITAL_COMMUNITY)
Admission: RE | Admit: 2021-06-03 | Discharge: 2021-06-03 | Disposition: A | Discharge status: Home / Self Care | Payer: BC Managed Care – PPO | Source: Ambulatory Visit | Attending: Obstetrics and Gynecology | Admitting: Obstetrics and Gynecology

## 2021-06-03 ENCOUNTER — Other Ambulatory Visit: Payer: Self-pay

## 2021-06-03 VITALS — BP 130/74 | Ht 62.0 in | Wt 304.6 lb

## 2021-06-03 DIAGNOSIS — Z1231 Encounter for screening mammogram for malignant neoplasm of breast: Secondary | ICD-10-CM

## 2021-06-03 DIAGNOSIS — Z1239 Encounter for other screening for malignant neoplasm of breast: Secondary | ICD-10-CM

## 2021-06-03 DIAGNOSIS — Z124 Encounter for screening for malignant neoplasm of cervix: Secondary | ICD-10-CM | POA: Insufficient documentation

## 2021-06-03 DIAGNOSIS — Z01419 Encounter for gynecological examination (general) (routine) without abnormal findings: Secondary | ICD-10-CM | POA: Diagnosis not present

## 2021-06-03 DIAGNOSIS — Z0000 Encounter for general adult medical examination without abnormal findings: Secondary | ICD-10-CM | POA: Diagnosis not present

## 2021-06-03 DIAGNOSIS — Z1211 Encounter for screening for malignant neoplasm of colon: Secondary | ICD-10-CM

## 2021-06-03 NOTE — Progress Notes (Signed)
Gynecology Annual Exam  PCP: [facility]  Chief Complaint:  Chief Complaint  Patient presents with   Gynecologic Exam    History of Present Illness: Patient is a 60 y.o. G0P0000 presents for annual exam. The patient has no complaints today.   LMP: No LMP recorded. Patient is postmenopausal. She denies postmenopausal bleeding or spotting  The patient is sexually active. She denies dyspareunia.  Postcoital Bleeding: no   The patient does perform self breast exams.  There is notable family history of breast or ovarian cancer in her family.  The patient has regular exercise: works out with a Physiological scientist 3 days a week  The patient denies current symptoms of depression.   PHQ-9: 3 GAD-7: 0   Review of Systems: Review of Systems  Constitutional:  Negative for chills, fever, malaise/fatigue and weight loss.  HENT:  Negative for congestion, hearing loss and sinus pain.   Eyes:  Negative for blurred vision and double vision.  Respiratory:  Negative for cough, sputum production, shortness of breath and wheezing.   Cardiovascular:  Negative for chest pain, palpitations, orthopnea and leg swelling.  Gastrointestinal:  Negative for abdominal pain, constipation, diarrhea, nausea and vomiting.  Genitourinary:  Negative for dysuria, flank pain, frequency, hematuria and urgency.  Musculoskeletal:  Negative for back pain, falls and joint pain.  Skin:  Negative for itching and rash.  Neurological:  Negative for dizziness and headaches.  Psychiatric/Behavioral:  Negative for depression, substance abuse and suicidal ideas. The patient is not nervous/anxious.    Past Medical History:  Past Medical History:  Diagnosis Date   Bradycardia    Chest pain    Fibroids    GERD (gastroesophageal reflux disease)    Heart palpitations    Hiatal hernia    Hypertension    Hypothyroidism    IBS (irritable bowel syndrome)    Migraines    Monoclonal gammopathy    Obesity     Sleep apnea    Varicose veins of both lower extremities     Past Surgical History:  Past Surgical History:  Procedure Laterality Date   COLONOSCOPY  2011   COLONOSCOPY N/A 08/19/2016   Procedure: COLONOSCOPY;  Surgeon: Manya Silvas, MD;  Location: [facility];  Service: Endoscopy;  Laterality: N/A;   ESOPHAGOGASTRODUODENOSCOPY      Gynecologic History:  No LMP recorded. Patient is postmenopausal. Last Pap: Results were: 2019  Last mammogram: 2021 Results were: BI-RAD I  Obstetric History: G0P0000  Family History:  Family History  Problem Relation Age of Onset   Breast cancer Paternal Aunt 67       paternal great aunt   Diabetes Mother    Hypertension Mother    Osteoarthritis Mother    Deep vein thrombosis Mother    Diabetes Father    Hypertension Father    Hyperlipidemia Father    Peripheral vascular disease Father    Deep vein thrombosis Father    Prostate cancer Father    Hypertension Sister    Depression Sister    Deep vein thrombosis Sister    Epilepsy Brother    Colon cancer Maternal Grandmother    Diabetes Maternal Grandmother    Diabetes Maternal Grandfather    Hyperlipidemia Maternal Grandfather    Diabetes Paternal Grandmother    Mental retardation Brother    Mental retardation Brother    Hypertension Brother    Diabetes Brother    Peripheral vascular disease Brother    Diabetes Maternal Aunt  Social History:  Social History   Socioeconomic History   Marital status: Single    Spouse name: Not on file   Number of children: 0   Years of education: Not on file   Highest education level: Not on file  Occupational History   Occupation: Support Specialist  Tobacco Use   Smoking status: Never   Smokeless tobacco: Never  Vaping Use   Vaping Use: Never used  Substance and Sexual Activity   Alcohol use: No   Drug use: No   Sexual activity: Not Currently    Birth control/protection: Post-menopausal  Other Topics Concern   Not on file   Social History Narrative   Not on file   Social Determinants of Health   Financial Resource Strain: Not on file  Food Insecurity: Not on file  Transportation Needs: Not on file  Physical Activity: Not on file  Stress: Not on file  Social Connections: Not on file  Intimate Partner Violence: Not on file    Allergies:  Allergies  Allergen Reactions   Lisinopril Cough    Medications: Prior to Admission medications   Medication Sig Start Date End Date Taking? Authorizing Provider  aspirin EC 81 MG tablet Take 81 mg by mouth daily.   Yes [provider]  CINNAMON PO Take by mouth.   Yes [provider]  ibuprofen (ADVIL,MOTRIN) 800 MG tablet Take 1 tablet (800 mg total) by mouth 3 (three) times daily. 02/03/13  Yes Pollina, Gwenyth Allegra, MD  Multiple Vitamin (MULTIVITAMIN) tablet Take 1 tablet by mouth daily.   Yes [provider]  Multiple Vitamins-Minerals (ONE DAILY CALCIUM/IRON) TABS Take 1 tablet by mouth daily.   Yes [provider]  TURMERIC CURCUMIN PO Take by mouth.   Yes [provider]  OVER THE COUNTER MEDICATION Take 1 tablet by mouth daily.    [provider]  triamcinolone ointment (KENALOG) 0.1 % Apply 1 application topically 2 (two) times daily. Patient not taking: Reported on 06/03/2021 04/27/20   [provider]  triamterene-hydrochlorothiazide (DYAZIDE) 37.5-25 MG capsule 1 capsule daily.  01/04/14   [provider]    Physical Exam Vitals: Blood pressure 130/74, height '5\' 2"'$  (1.575 m), weight (!) 304 lb 9.6 oz (138.2 kg).  Physical Exam Constitutional:      Appearance: She is well-developed.  Genitourinary:     Genitourinary Comments: External: Normal appearing vulva. No lesions noted.  Speculum examination: Normal appearing cervix. No blood in the vaginal vault. NO discharge.  No IUD strings seen Bimanual examination: Uterus midline, non-tender, normal in size, shape and contour.  No  CMT. No adnexal masses. No adnexal tenderness. Pelvis not fixed.  Breast Exam: breast equal without skin changes, nipple discharge, breast lump or enlarged lymph nodes      HENT:     Head: Normocephalic and atraumatic.  Neck:     Thyroid: No thyromegaly.  Cardiovascular:     Rate and Rhythm: Normal rate and regular rhythm.     Heart sounds: Normal heart sounds.  Pulmonary:     Effort: Pulmonary effort is normal.     Breath sounds: Normal breath sounds.  Abdominal:     General: Bowel sounds are normal. There is no distension.     Palpations: Abdomen is soft. There is no mass.  Musculoskeletal:     Cervical back: Neck supple.  Neurological:     Mental Status: She is alert and oriented to person, place, and time.  Skin:  General: Skin is warm and dry.  Psychiatric:        Behavior: Behavior normal.        Thought Content: Thought content normal.        Judgment: Judgment normal.  Vitals reviewed.     Female chaperone present for pelvic and breast  portions of the physical exam  Assessment: 60 y.o. G0P0000 routine annual exam  Plan: Problem List Items Addressed This Visit   None Visit Diagnoses     Encounter for annual routine gynecological examination    -  Primary   Health maintenance examination       Breast cancer screening by mammogram       Relevant Orders   MM 3D SCREEN BREAST BILATERAL   Cervical cancer screening       Relevant Orders   Cytology - PAP   Encounter for screening breast examination       Encounter for gynecological examination without abnormal finding       Colon cancer screening       Relevant Orders   Ambulatory referral to Gastroenterology       1) Mammogram - recommend yearly screening mammogram.  Mammogram Was ordered today  2) STI screening was offered and declined  3) ASCCP guidelines and rational discussed.  Pap smear today  4) Colonoscopy -- Last in 2017, recommended every 5 years, referral placed.   5) Routine  healthcare maintenance including cholesterol, diabetes screening discussed managed by PCP  6) Osteoporosis screening - no increased risk factors at this time  FRAX SCORE Age: 25 Sex: female Weight: 138 kg Height: 157 cm Previous Fracture: No Parent Hip Fracture: No Current Smoking: No Glucocorticoids: No Rheumatoid arthritis: Yes Secondary osteoporosis: No Alcohol ( 3 or more units a day):  No Femoral Neck BMD (g/cm2): none  RESULT 10 year risk of Major Osteoporotic Fracture: 2.1% 10 year risk of Hip Fracture: 0.1%  7) Reports rash around her anal area.  She reports that this is been going on for a little over a year.  She has itching in that area.  She will apply an anti-itch ointment and Vaseline at home.  Generally this will help for 1 to 2 days but then the itching will return.  The itching is generally worse after bowel movements or urination.  She saw the GI doctor who gave her simvastatin and recommended a dermatology follow-up.  She has dermatology follow-up planned for September.  Area on exam shows loss of pigmentation.  Discussed that this might be consistent with a lichen chronicus or lichen sclerosis.  Recommended dermatology follow-up and discussed that they may recommend a topical steroid ointment.    Adrian Prows MD, [facility], [facility] 06/03/2021 2:01 PM

## 2021-06-03 NOTE — Patient Instructions (Signed)
Institute of Medicine Recommended Dietary Allowances for Calcium and Vitamin D  Age (yr) Calcium Recommended Dietary Allowance (mg/day) Vitamin D Recommended Dietary Allowance (international units/day)  9-18 1,300 600  19-50 1,000 600  51-70 1,200 600  71 and older 1,200 800  Data from [employer] of [employer]. Dietary reference intakes: calcium, vitamin D. [place], [place]: National Academies Press; 2011.   Exercising to Stay Healthy To become healthy and stay healthy, it is recommended that you do moderate-intensity and vigorous-intensity exercise. You can tell that you are exercising at a moderate intensity if your heart starts beating faster and you start breathing faster but can still hold a conversation. You can tell that you are exercising at a vigorous intensity if you are breathing much harder andfaster and cannot hold a conversation while exercising. Exercising regularly is important. It has many health benefits, such as: Improving overall fitness, flexibility, and endurance. Increasing bone density. Helping with weight control. Decreasing body fat. Increasing muscle strength. Reducing stress and tension. Improving overall health. How often should I exercise? Choose an activity that you enjoy, and set realistic goals. Your health careprovider can help you make an activity plan that works for you. Exercise regularly as told by your health care provider. This may include: Doing strength training two times a week, such as: Lifting weights. Using resistance bands. Push-ups. Sit-ups. Yoga. Doing a certain intensity of exercise for a given amount of time. Choose from these options: A total of 150 minutes of moderate-intensity exercise every week. A total of 75 minutes of vigorous-intensity exercise every week. A mix of moderate-intensity and vigorous-intensity exercise every week. Children, pregnant women, people who have not exercised regularly, people who are overweight, and  older adults may need to talk with a health care provider about what activities are safe to do. If you have a medical condition, be sureto talk with your health care provider before you start a new exercise program. What are some exercise ideas? Moderate-intensity exercise ideas include: Walking 1 mile (1.6 km) in about 15 minutes. Biking. Hiking. Golfing. Dancing. Water aerobics. Vigorous-intensity exercise ideas include: Walking 4.5 miles (7.2 km) or more in about 1 hour. Jogging or running 5 miles (8 km) in about 1 hour. Biking 10 miles (16.1 km) or more in about 1 hour. Lap swimming. Roller-skating or in-line skating. Cross-country skiing. Vigorous competitive sports, such as football, basketball, and soccer. Jumping rope. Aerobic dancing. What are some everyday activities that can help me to get exercise? Yard work, such as: Pushing a lawn mower. Raking and bagging leaves. Washing your car. Pushing a stroller. Shoveling snow. Gardening. Washing windows or floors. How can I be more active in my day-to-day activities? Use stairs instead of an elevator. Take a walk during your lunch break. If you drive, park your car farther away from your work or school. If you take public transportation, get off one stop early and walk the rest of the way. Stand up or walk around during all of your indoor phone calls. Get up, stretch, and walk around every 30 minutes throughout the day. Enjoy exercise with a friend. Support to continue exercising will help you keep a regular routine of activity. What guidelines can I follow while exercising? Before you start a new exercise program, talk with your health care provider. Do not exercise so much that you hurt yourself, feel dizzy, or get very short of breath. Wear comfortable clothes and wear shoes with good support. Drink plenty of water while you exercise to prevent   dehydration or heat stroke. Work out until your breathing and your  heartbeat get faster. Where to find more information [place] Department of Health and Human Services: www.hhs.gov Centers for Disease Control and Prevention (CDC): www.cdc.gov Summary Exercising regularly is important. It will improve your overall fitness, flexibility, and endurance. Regular exercise also will improve your overall health. It can help you control your weight, reduce stress, and improve your bone density. Do not exercise so much that you hurt yourself, feel dizzy, or get very short of breath. Before you start a new exercise program, talk with your health care provider. This information is not intended to replace advice given to you by your health care provider. Make sure you discuss any questions you have with your healthcare provider. Document Revised: 09/11/2020 Document Reviewed: 09/11/2020 Elsevier Patient Education  2022 Else[employer] Inc. Budget-Friendly Healthy Eating There are many ways to save money at the grocery store and continue to eat healthy. You can be successful if you: Plan meals according to your budget. Make a grocery list and only purchase food according to your grocery list. Prepare food yourself at home. What are tips for following this plan? Reading food labels Compare food labels between brand name foods and the store brand. Often the nutritional value is the same, but the store brand is lower cost. Look for products that do not have added sugar, fat, or salt (sodium). These often cost the same but are healthier for you. Products may be labeled as: Sugar-free. Nonfat. Low-fat. Sodium-free. Low-sodium. Look for lean ground beef labeled as at least 92% lean and 8% fat. Shopping  Buy only the items on your grocery list and go only to the areas of the store that have the items on your list. Use coupons only for foods and brands you normally buy. Avoid buying items you wouldn't normally buy simply because they are on sale. Check online and in newspapers for  weekly deals. Buy healthy items from the bulk bins when available, such as herbs, spices, flour, pasta, nuts, and dried fruit. Buy fruits and vegetables that are in season. Prices are usually lower on in-season produce. Look at the unit price on the price tag. Use it to compare different brands and sizes to find out which item is the best deal. Choose healthy items that are often low-cost, such as carrots, potatoes, apples, bananas, and oranges. Dried or canned beans are a low-cost protein source. Buy in bulk and freeze extra food. Items you can buy in bulk include meats, fish, poultry, frozen fruits, and frozen vegetables. Avoid buying "ready-to-eat" foods, such as pre-cut fruits and vegetables and pre-made salads. If possible, shop around to discover where you can find the best prices. Consider other retailers such as dollar stores, larger wholesale stores, local fruit and vegetable stands, and farmers markets. Do not shop when you are hungry. If you shop while hungry, it may be hard to stick to your list and budget. Resist impulse buying. Use your grocery list as your official plan for the week. Buy a variety of vegetables and fruits by purchasing fresh, frozen, and canned items. Look at the top and bottom shelves for deals. Foods at eye level (eye level of an adult or child) are usually more expensive. Be efficient with your time when shopping. The more time you spend at the store, the more money you are likely to spend. To save money when choosing more expensive foods like meats and dairy: Choose cheaper cuts of meat, such as bone-in   chicken thighs and drumsticks instead of skinless and boneless chicken. When you are ready to prepare the chicken, you can remove the skin yourself to make it healthier. Choose lean meats like chicken or turkey instead of beef. Choose canned seafood, such as tuna, salmon, or sardines. Buy eggs as a low-cost source of protein. Buy dried beans and peas, such as  lentils, split peas, or kidney beans instead of meats. Dried beans and peas are a good alternative source of protein. Buy the larger tubs of yogurt instead of individual-sized containers. Choose water instead of sodas and other sweetened beverages. Avoid buying chips, cookies, and other "junk food." These items are usually expensive and not healthy.  Cooking Make extra food and freeze the extras in meal-sized containers or in individual portions for fast meals and snacks. Pre-cook on days when you have extra time to prepare meals in advance. You can keep these meals in the fridge or freezer and reheat for a quick meal. When you come home from the grocery store, wash, peel, and cut fruits and vegetables so they are ready to use and eat. This will help reduce food waste. Meal planning Do not eat out or get fast food. Prepare food at home. Make a grocery list and make sure to bring it with you to the store. If you have a smart phone, you could use your phone to create your shopping list. Plan meals and snacks according to a grocery list and budget you create. Use leftovers in your meal plan for the week. Look for recipes where you can cook once and make enough food for two meals. Prepare budget-friendly types of meals like stews, casseroles, and stir-fry dishes. Try some meatless meals or try "no cook" meals like salads. Make sure that half your plate is filled with fruits or vegetables. Choose from fresh, frozen, or canned fruits and vegetables. If eating canned, remember to rinse them before eating. This will remove any excess salt added for packaging. Summary Eating healthy on a budget is possible if you plan your meals according to your budget, purchase according to your budget and grocery list, and prepare food yourself. Tips for buying more food on a limited budget include buying generic brands, using coupons only for foods you normally buy, and buying healthy items from the bulk bins when  available. Tips for buying cheaper food to replace expensive food include choosing cheaper, lean cuts of meat, and buying dried beans and peas. This information is not intended to replace advice given to you by your health care provider. Make sure you discuss any questions you have with your healthcare provider. Document Revised: 07/09/2020 Document Reviewed: 07/09/2020 Elsevier Patient Education  2022 Elsevier Inc. Bone Health Bones protect organs, store calcium, anchor muscles, and support the whole body. Keeping your bones strong is important, especially as you get older. Youcan take actions to help keep your bones strong and healthy. Why is keeping my bones healthy important?  Keeping your bones healthy is important because your body constantly replaces bone cells. Cells get old, and new cells take their place. As we age, we lose bone cells because the body may not be able to make enough new cells to replace the old cells. The amount of bone cells and bone tissue you have is referred toas bone mass. The higher your bone mass, the stronger your bones. The aging process leads to an overall loss of bone mass in the body, which can increase the likelihood of: Joint pain   and stiffness. Broken bones. A condition in which the bones become weak and brittle (osteoporosis). A large decline in bone mass occurs in older adults. In women, it occurs aboutthe time of menopause. What actions can I take to keep my bones healthy? Good health habits are important for maintaining healthy bones. This includes eating nutritious foods and exercising regularly. To have healthy bones, you need to get enough of the right minerals and vitamins. Most nutrition experts recommend getting these nutrients from the foods that you eat. In some cases, taking supplements may also be recommended. Doing certain types of exercise isalso important for bone health. What are the nutritional recommendations for healthy bones?  Eating  a well-balanced diet with plenty of calcium and vitamin D will help to protect your bones. Nutritional recommendations vary from person to person. Ask your health care provider what is healthy for you. Here are some generalguidelines. Get enough calcium Calcium is the most important (essential) mineral for bone health. Most people can get enough calcium from their diet, but supplements may be recommended for people who are at risk for osteoporosis. Good sources of calcium include: Dairy products, such as low-fat or nonfat milk, cheese, and yogurt. Dark green leafy vegetables, such as bok choy and broccoli. Calcium-fortified foods, such as orange juice, cereal, bread, soy beverages, and tofu products. Nuts, such as almonds. Follow these recommended amounts for daily calcium intake: Children, age 1-3: 700 mg. Children, age 4-8: 1,000 mg. Children, age 9-13: 1,300 mg. Teens, age 14-18: 1,300 mg. Adults, age 19-50: 1,000 mg. Adults, age 51-70: Men: 1,000 mg. Women: 1,200 mg. Adults, age 71 or older: 1,200 mg. Pregnant and breastfeeding females: Teens: 1,300 mg. Adults: 1,000 mg. Get enough vitamin D Vitamin D is the most essential vitamin for bone health. It helps the body absorb calcium. Sunlight stimulates the skin to make vitamin D, so be sure to get enough sunlight. If you live in a cold climate or you do not get outside often, your health care provider may recommend that you take vitamin D supplements. Good sources of vitamin D in your diet include: Egg yolks. Saltwater fish. Milk and cereal fortified with vitamin D. Follow these recommended amounts for daily vitamin D intake: Children and teens, age 1-18: 600 international units. Adults, age 50 or younger: 400-800 international units. Adults, age 51 or older: 800-1,000 international units. Get other important nutrients Other nutrients that are important for bone health include: Phosphorus. This mineral is found in meat, poultry,  dairy foods, nuts, and legumes. The recommended daily intake for adult men and adult women is 700 mg. Magnesium. This mineral is found in seeds, nuts, dark green vegetables, and legumes. The recommended daily intake for adult men is 400-420 mg. For adult women, it is 310-320 mg. Vitamin K. This vitamin is found in green leafy vegetables. The recommended daily intake is 120 mg for adult men and 90 mg for adult women. What type of physical activity is best for building and maintaining healthybones? Weight-bearing and strength-building activities are important for building and maintaining healthy bones. Weight-bearing activities cause muscles and bones to work against gravity. Strength-building activities increase the strength of the muscles that support bones. Weight-bearing and muscle-building activities include: Walking and hiking. Jogging and running. Dancing. Gym exercises. Lifting weights. Tennis and racquetball. Climbing stairs. Aerobics. Adults should get at least 30 minutes of moderate physical activity on most days. Children should get at least 60 minutes of moderate physical activity onmost days. Ask your health care   provider what type of exercise is best for you. How can I find out if my bone mass is low? Bone mass can be measured with an X-ray test called a bone mineral density (BMD) test. This test is recommended for all women who are age 65 or older. It may also be recommended for: Men who are age 70 or older. People who are at risk for osteoporosis because of: Having bones that break easily. Having a long-term disease that weakens bones, such as kidney disease or rheumatoid arthritis. Having menopause earlier than normal. Taking medicine that weakens bones, such as steroids, thyroid hormones, or hormone treatment for breast cancer or prostate cancer. Smoking. Drinking three or more alcoholic drinks a day. If you find that you have a low bone mass, you may be able to  preventosteoporosis or further bone loss by changing your diet and lifestyle. Where can I find more information? For more information, check out the following websites: National Osteoporosis Foundation: www.nof.org/patients National Institutes of Health: www.bones.nih.gov International Osteoporosis Foundation: www.iofbonehealth.org Summary The aging process leads to an overall loss of bone mass in the body, which can increase the likelihood of broken bones and osteoporosis. Eating a well-balanced diet with plenty of calcium and vitamin D will help to protect your bones. Weight-bearing and strength-building activities are also important for building and maintaining strong bones. Bone mass can be measured with an X-ray test called a bone mineral density (BMD) test. This information is not intended to replace advice given to you by your health care provider. Make sure you discuss any questions you have with your healthcare provider. Document Revised: 10/23/2017 Document Reviewed: 10/23/2017 Elsevier Patient Education  2022 Elsevier Inc.  

## 2021-06-08 LAB — CYTOLOGY - PAP
Comment: NEGATIVE
Diagnosis: NEGATIVE
High risk HPV: NEGATIVE

## 2021-06-10 ENCOUNTER — Other Ambulatory Visit (INDEPENDENT_AMBULATORY_CARE_PROVIDER_SITE_OTHER): Payer: Self-pay

## 2021-06-10 DIAGNOSIS — Z1211 Encounter for screening for malignant neoplasm of colon: Secondary | ICD-10-CM

## 2021-06-10 MED ORDER — PEG 3350-KCL-NA BICARB-NACL 420 G PO SOLR: 4000.0000 mL | Freq: Once | ORAL | 0 refills | Status: AC

## 2021-06-10 NOTE — Progress Notes (Signed)
Gastroenterology Pre-Procedure Review  Request Date: 07/16/2021 Requesting Physician: Dr. Bonna Gains  PATIENT REVIEW QUESTIONS: The patient responded to the following health history questions as indicated:    1. Are you having any GI issues? no 2. Do you have a personal history of Polyps? no 3. Do you have a family history of Colon Cancer or Polyps? yes (Mother colon cancer) 4. Diabetes Mellitus? no 5. Joint replacements in the past 12 months?no 6. Major health problems in the past 3 months?no 7. Any artificial heart valves, MVP, or defibrillator?no    MEDICATIONS & ALLERGIES:    Patient reports the following regarding taking any anticoagulation/antiplatelet therapy:   Plavix, Coumadin, Eliquis, Xarelto, Lovenox, Pradaxa, Brilinta, or Effient? no Aspirin? yes (81 mg)  Patient confirms/reports the following medications:  Current Outpatient Medications  Medication Sig Dispense Refill   aspirin EC 81 MG tablet Take 81 mg by mouth daily.     CINNAMON PO Take by mouth.     ibuprofen (ADVIL,MOTRIN) 800 MG tablet Take 1 tablet (800 mg total) by mouth 3 (three) times daily. 21 tablet 0   Multiple Vitamin (MULTIVITAMIN) tablet Take 1 tablet by mouth daily.     Multiple Vitamins-Minerals (ONE DAILY CALCIUM/IRON) TABS Take 1 tablet by mouth daily.     OVER THE COUNTER MEDICATION Take 1 tablet by mouth daily.     triamcinolone ointment (KENALOG) 0.1 % Apply 1 application topically 2 (two) times daily. (Patient not taking: Reported on 06/03/2021)     triamterene-hydrochlorothiazide (DYAZIDE) 37.5-25 MG capsule 1 capsule daily.      TURMERIC CURCUMIN PO Take by mouth.     No current facility-administered medications for this visit.    Patient confirms/reports the following allergies:  Allergies  Allergen Reactions   Lisinopril Cough    No orders of the defined types were placed in this encounter.   AUTHORIZATION INFORMATION Primary Insurance: 1D#: Group #:  Secondary  Insurance: 1D#: Group #:  SCHEDULE INFORMATION: Date: 07/16/2021 Time: Location: [place]

## 2021-06-16 ENCOUNTER — Telehealth: Payer: Self-pay | Admitting: Gastroenterology

## 2021-06-16 ENCOUNTER — Other Ambulatory Visit: Payer: Self-pay

## 2021-06-16 NOTE — Telephone Encounter (Signed)
Pt. Calling to cancel and reschedule her colonoscopy.

## 2021-06-16 NOTE — Telephone Encounter (Signed)
LVM that she needs to r/s her procedure.

## 2021-06-16 NOTE — Progress Notes (Signed)
Patient called to reschedule her procedure I got her rescheduled and new paperwork sent out

## 2021-07-02 DIAGNOSIS — M25561 Pain in right knee: Secondary | ICD-10-CM | POA: Diagnosis not present

## 2021-07-12 DIAGNOSIS — L732 Hidradenitis suppurativa: Secondary | ICD-10-CM | POA: Diagnosis not present

## 2021-07-12 DIAGNOSIS — L700 Acne vulgaris: Secondary | ICD-10-CM | POA: Diagnosis not present

## 2021-07-12 DIAGNOSIS — L710 Perioral dermatitis: Secondary | ICD-10-CM | POA: Diagnosis not present

## 2021-07-12 DIAGNOSIS — L280 Lichen simplex chronicus: Secondary | ICD-10-CM | POA: Diagnosis not present

## 2021-07-15 ENCOUNTER — Ambulatory Visit: Payer: BC Managed Care – PPO

## 2021-08-05 ENCOUNTER — Ambulatory Visit
Admission: RE | Admit: 2021-08-05 | Discharge: 2021-08-05 | Disposition: A | Discharge status: Home / Self Care | Payer: BC Managed Care – PPO | Source: Ambulatory Visit | Attending: Obstetrics and Gynecology | Admitting: Obstetrics and Gynecology

## 2021-08-05 ENCOUNTER — Other Ambulatory Visit: Payer: Self-pay

## 2021-08-05 DIAGNOSIS — Z1231 Encounter for screening mammogram for malignant neoplasm of breast: Secondary | ICD-10-CM | POA: Diagnosis not present

## 2021-08-06 ENCOUNTER — Ambulatory Visit: Payer: BC Managed Care – PPO | Admitting: Certified Registered"

## 2021-08-06 ENCOUNTER — Ambulatory Visit
Admission: RE | Admit: 2021-08-06 | Discharge: 2021-08-06 | Disposition: A | Discharge status: Home / Self Care | Payer: BC Managed Care – PPO | Attending: Gastroenterology | Admitting: Gastroenterology

## 2021-08-06 ENCOUNTER — Encounter
Admission: RE | Disposition: A | Discharge status: Home / Self Care | Payer: Self-pay | Source: Home / Self Care | Attending: Gastroenterology

## 2021-08-06 DIAGNOSIS — Z1211 Encounter for screening for malignant neoplasm of colon: Secondary | ICD-10-CM

## 2021-08-06 DIAGNOSIS — D649 Anemia, unspecified: Secondary | ICD-10-CM | POA: Diagnosis not present

## 2021-08-06 DIAGNOSIS — Z7982 Long term (current) use of aspirin: Secondary | ICD-10-CM | POA: Insufficient documentation

## 2021-08-06 DIAGNOSIS — Z79899 Other long term (current) drug therapy: Secondary | ICD-10-CM | POA: Insufficient documentation

## 2021-08-06 DIAGNOSIS — Z800 Family history of malignant neoplasm of digestive organs: Secondary | ICD-10-CM | POA: Insufficient documentation

## 2021-08-06 DIAGNOSIS — K635 Polyp of colon: Secondary | ICD-10-CM | POA: Diagnosis not present

## 2021-08-06 DIAGNOSIS — Z9109 Other allergy status, other than to drugs and biological substances: Secondary | ICD-10-CM | POA: Diagnosis not present

## 2021-08-06 DIAGNOSIS — K648 Other hemorrhoids: Secondary | ICD-10-CM | POA: Insufficient documentation

## 2021-08-06 HISTORY — PX: COLONOSCOPY WITH PROPOFOL: SHX5780

## 2021-08-06 SURGERY — COLONOSCOPY WITH PROPOFOL
Anesthesia: General

## 2021-08-06 MED ORDER — PROPOFOL 10 MG/ML IV BOLUS
INTRAVENOUS | Status: DC | PRN
  Administered 2021-08-06: 60 mg via INTRAVENOUS

## 2021-08-06 MED ORDER — PROPOFOL 10 MG/ML IV BOLUS
INTRAVENOUS | Status: AC
  Filled 2021-08-06: qty 20

## 2021-08-06 MED ORDER — SODIUM CHLORIDE 0.9 % IV SOLN
INTRAVENOUS | Status: DC
  Administered 2021-08-06: 20 mL/h via INTRAVENOUS

## 2021-08-06 MED ORDER — PROPOFOL 500 MG/50ML IV EMUL
INTRAVENOUS | Status: DC | PRN
  Administered 2021-08-06: 150 ug/kg/min via INTRAVENOUS

## 2021-08-06 MED ORDER — LIDOCAINE HCL (CARDIAC) PF 100 MG/5ML IV SOSY
PREFILLED_SYRINGE | INTRAVENOUS | Status: DC | PRN
  Administered 2021-08-06: 50 mg via INTRAVENOUS

## 2021-08-06 NOTE — H&P (Signed)
[name], MD [address], [place], [place], [place], [id number], [place], [place], [place], [address] Phone: [phone]  Fax: [phone]  Primary Care Physician:  [facility]   Pre-Procedure History & Physical: HPI:  Yolanda Stephens is a 60 y.o. female is here for a colonoscopy.   Past Medical History:  Diagnosis Date   Bradycardia    Chest pain    Fibroids    GERD (gastroesophageal reflux disease)    Heart palpitations    Hiatal hernia    Hypertension    Hypothyroidism    IBS (irritable bowel syndrome)    Migraines    Monoclonal gammopathy    Obesity    Sleep apnea    Varicose veins of both lower extremities     Past Surgical History:  Procedure Laterality Date   COLONOSCOPY  2011   COLONOSCOPY N/A 08/19/2016   Procedure: COLONOSCOPY;  Surgeon: Manya Silvas, MD;  Location: [facility];  Service: Endoscopy;  Laterality: N/A;   ESOPHAGOGASTRODUODENOSCOPY      Prior to Admission medications   Medication Sig Start Date End Date Taking? Authorizing Provider  aspirin EC 81 MG tablet Take 81 mg by mouth daily.   Yes [provider]  CINNAMON PO Take by mouth.   Yes [provider]  ibuprofen (ADVIL,MOTRIN) 800 MG tablet Take 1 tablet (800 mg total) by mouth 3 (three) times daily. 02/03/13  Yes Pollina, Gwenyth Allegra, MD  Multiple Vitamin (MULTIVITAMIN) tablet Take 1 tablet by mouth daily.   Yes [provider]  Multiple Vitamins-Minerals (ONE DAILY CALCIUM/IRON) TABS Take 1 tablet by mouth daily.   Yes [provider]  OVER THE COUNTER MEDICATION Take 1 tablet by mouth daily.   Yes [provider]  triamcinolone ointment (KENALOG) 0.1 % Apply 1 application topically 2 (two) times daily. 04/27/20  Yes [provider]  triamterene-hydrochlorothiazide (DYAZIDE) 37.5-25 MG capsule 1 capsule daily.  01/04/14  Yes [provider]  TURMERIC CURCUMIN PO Take by mouth.    Yes [provider]    Allergies as of 06/10/2021 - Review Complete 06/03/2021  Allergen Reaction Noted   Lisinopril Cough 01/29/2016    Family History  Problem Relation Age of Onset   Breast cancer Paternal Aunt 15       paternal great aunt   Diabetes Mother    Hypertension Mother    Osteoarthritis Mother    Deep vein thrombosis Mother    Diabetes Father    Hypertension Father    Hyperlipidemia Father    Peripheral vascular disease Father    Deep vein thrombosis Father    Prostate cancer Father    Hypertension Sister    Depression Sister    Deep vein thrombosis Sister    Epilepsy Brother    Colon cancer Maternal Grandmother    Diabetes Maternal Grandmother    Diabetes Maternal Grandfather    Hyperlipidemia Maternal Grandfather    Diabetes Paternal Grandmother    Mental retardation Brother    Mental retardation Brother    Hypertension Brother    Diabetes Brother    Peripheral vascular disease Brother    Diabetes Maternal Aunt     Social History   Socioeconomic History   Marital status: Single    Spouse name: Not on file   Number of children: 0   Years of education: Not on file   Highest education level: Not on file  Occupational History   Occupation: Health visitor  Tobacco Use   Smoking status: Never   Smokeless tobacco: Never  Vaping Use   Vaping Use: Never used  Substance and Sexual Activity   Alcohol use: No   Drug use: No   Sexual activity: Not Currently    Birth control/protection: Post-menopausal  Other Topics Concern   Not on file  Social History Narrative   Not on file   Social Determinants of Health   Financial Resource Strain: Not on file  Food Insecurity: Not on file  Transportation Needs: Not on file  Physical Activity: Not on file  Stress: Not on file  Social Connections: Not on file  Intimate Partner Violence: Not on file    Review of Systems: See HPI, otherwise negative ROS  Physical  Exam: Constitutional: General:   Alert,  Well-developed, well-nourished, pleasant and cooperative in NAD BP (!) 155/84   Pulse (!) 58   Temp (!) 96.4 F (35.8 C) (Temporal)   Resp 20   Ht 5\' 1"  (1.549 m)   Wt (!) 137 kg   SpO2 100%   BMI 57.06 kg/m   Head: Normocephalic, atraumatic.   Eyes:  Sclera clear, no icterus.   Conjunctiva pink.   Mouth:  No deformity or lesions, oropharynx pink & moist.  Neck:  Supple, trachea midline  Respiratory: Normal respiratory effort  Gastrointestinal:  Soft, non-tender and non-distended without masses, hepatosplenomegaly or hernias noted.  No guarding or rebound tenderness.     Cardiac: No clubbing or edema.  No cyanosis. Normal posterior tibial pedal pulses noted.  Lymphatic:  No significant cervical adenopathy.  Psych:  Alert and cooperative. Normal mood and affect.  Musculoskeletal:   Symmetrical without gross deformities. 5/5 Lower extremity strength bilaterally.  Skin: Warm. Intact without significant lesions or rashes. No jaundice.  Neurologic:  Face symmetrical, tongue midline, Normal sensation to touch;  grossly normal neurologically.  Psych:  Alert and oriented x3, Alert and cooperative. Normal mood and affect.  Impression/Plan: Yolanda Stephens is here for a colonoscopy to be performed for average risk screening. Last colonoscopy by Dr. Vira Agar in 2017. Recommendations on that procedure report was for repeat exam in 5 years. The indication on that procedure report was family history of colon polyps. Pt denies any family history of colon cancer in first degree relatives. Reports her grandmother had colon cancer.  Risks, benefits, limitations, and alternatives regarding  colonoscopy have been reviewed with the patient.  Questions have been answered.  All parties agreeable.   Virgel Manifold, MD  08/06/2021, 10:52 AM

## 2021-08-06 NOTE — Anesthesia Preprocedure Evaluation (Addendum)
Anesthesia Evaluation  Patient identified by MRN, date of birth, ID band Patient awake    Reviewed: Allergy & Precautions, NPO status , Patient's Chart, lab work & pertinent test results  History of Anesthesia Complications Negative for: history of anesthetic complications  Airway Mallampati: IV   Neck ROM: Full    Dental   Missing few molars:   Pulmonary sleep apnea and Continuous Positive Airway Pressure Ventilation ,    Pulmonary exam normal breath sounds clear to auscultation       Cardiovascular hypertension, Normal cardiovascular exam Rhythm:Regular Rate:Normal  Echo 12/17/20:  NORMAL LEFT VENTRICULAR SYSTOLIC FUNCTION NORMAL RIGHT VENTRICULAR SYSTOLIC FUNCTION TRIVIAL REGURGITATION NOTED  MILD VALVULAR STENOSIS  TRIVIAL MR, TR, PR AVA(VTI)= 1.79cm^2 MILD AS EF 60%    Neuro/Psych  Headaches,    GI/Hepatic hiatal hernia, GERD  ,  Endo/Other  Hypothyroidism Class 3 obesity  Renal/GU negative Renal ROS     Musculoskeletal   Abdominal   Peds  Hematology  (+) Blood dyscrasia, anemia ,   Anesthesia Other Findings Reviewed 12/22/20 cardiology note  Reproductive/Obstetrics                            Anesthesia Physical Anesthesia Plan  ASA: 3  Anesthesia Plan: General   Post-op Pain Management:    Induction: Intravenous  PONV Risk Score and Plan: 3 and Propofol infusion, TIVA and Treatment may vary due to age or medical condition  Airway Management Planned: Natural Airway  Additional Equipment:   Intra-op Plan:   Post-operative Plan:   Informed Consent: I have reviewed the patients History and Physical, chart, labs and discussed the procedure including the risks, benefits and alternatives for the proposed anesthesia with the patient or authorized representative who has indicated his/her understanding and acceptance.       Plan Discussed with: CRNA  Anesthesia Plan  Comments:         Anesthesia Quick Evaluation

## 2021-08-06 NOTE — Anesthesia Postprocedure Evaluation (Signed)
Anesthesia Post Note  Patient: Yolanda Stephens  Procedure(s) Performed: COLONOSCOPY WITH PROPOFOL  Patient location during evaluation: PACU Anesthesia Type: General Level of consciousness: awake and alert, oriented and patient cooperative Pain management: pain level controlled Vital Signs Assessment: post-procedure vital signs reviewed and stable Respiratory status: spontaneous breathing, nonlabored ventilation and respiratory function stable Cardiovascular status: blood pressure returned to baseline and stable Postop Assessment: adequate PO intake Anesthetic complications: no   No notable events documented.   Last Vitals:  Vitals:   08/06/21 1150 08/06/21 1200  BP: (!) 141/80 138/71  Pulse: 60 63  Resp: 18 19  Temp:    SpO2: 98% 100%    Last Pain:  Vitals:   08/06/21 1200  TempSrc:   PainSc: 0-No pain                 Darrin Nipper

## 2021-08-06 NOTE — Anesthesia Procedure Notes (Signed)
Procedure Name: MAC Date/Time: 08/06/2021 11:00 AM Performed by: Jerrye Noble, CRNA Pre-anesthesia Checklist: Patient identified, Emergency Drugs available, Suction available and Patient being monitored Patient Re-evaluated:Patient Re-evaluated prior to induction Oxygen Delivery Method: Nasal cannula

## 2021-08-06 NOTE — Transfer of Care (Signed)
Immediate Anesthesia Transfer of Care Note  Patient: Yolanda Stephens  Procedure(s) Performed: COLONOSCOPY WITH PROPOFOL  Patient Location: PACU and Endoscopy Unit  Anesthesia Type:General  Level of Consciousness: awake, drowsy and patient cooperative  Airway & Oxygen Therapy: Patient Spontanous Breathing  Post-op Assessment: Report given to RN and Post -op Vital signs reviewed and stable  Post vital signs: Reviewed and stable  Last Vitals:  Vitals Value Taken Time  BP 122/76 08/06/21 1131  Temp    Pulse 66 08/06/21 1132  Resp 20 08/06/21 1132  SpO2 98 % 08/06/21 1132  Vitals shown include unvalidated device data.  Last Pain:  Vitals:   08/06/21 1032  TempSrc: Temporal  PainSc: 0-No pain         Complications: No notable events documented.

## 2021-08-06 NOTE — Op Note (Signed)
[facility] Patient Name: Yolanda Stephens Procedure Date: 08/06/2021 10:48 AM MRN: [id number] Account #: [id number] Date of Birth: [birth year] Admit Type: Outpatient Age: 60 Room: [facility] ENDO ROOM 2 Gender: Female Note Status: Finalized Instrument Name: Jasper Riling [id number] Procedure:             Colonoscopy Indications:           Screening for colorectal malignant neoplasm Providers:             Sabriyah Wilcher B. Bonna Gains MD, MD Medicines:             Monitored Anesthesia Care Complications:         No immediate complications. Procedure:             Pre-Anesthesia Assessment:                        - ASA Grade Assessment: II - A patient with mild                         systemic disease.                        - Prior to the procedure, a History and Physical was                         performed, and patient medications, allergies and                         sensitivities were reviewed. The patient's tolerance                         of previous anesthesia was reviewed.                        - The risks and benefits of the procedure and the                         sedation options and risks were discussed with the                         patient. All questions were answered and informed                         consent was obtained.                        - Patient identification and proposed procedure were                         verified prior to the procedure by the physician, the                         nurse, the anesthesiologist, the anesthetist and the                         technician. The procedure was verified in the                         procedure room.  After obtaining informed consent, the colonoscope was                         passed under direct vision. Throughout the procedure,                         the patient's blood pressure, pulse, and oxygen                         saturations were monitored continuously.  The                         Colonoscope was introduced through the anus and                         advanced to the the cecum, identified by appendiceal                         orifice and ileocecal valve. The colonoscopy was                         performed with ease. The patient tolerated the                         procedure well. The quality of the bowel preparation                         was good. Findings:      The perianal and digital rectal examinations were normal.      A 5 mm polyp was found in the cecum. The polyp was sessile. The polyp       was removed with a cold snare. Resection and retrieval were complete.      The exam was otherwise without abnormality.      The rectum, sigmoid colon, descending colon, transverse colon, ascending       colon and cecum appeared normal.      Non-bleeding internal hemorrhoids were found during retroflexion. Impression:            - One 5 mm polyp in the cecum, removed with a cold                         snare. Resected and retrieved.                        - The examination was otherwise normal.                        - The rectum, sigmoid colon, descending colon,                         transverse colon, ascending colon and cecum are normal.                        - Non-bleeding internal hemorrhoids. Recommendation:        - Discharge patient to home (with escort).                        - Advance diet as tolerated.                        -  Continue present medications.                        - Await pathology results.                        - Repeat colonoscopy date to be determined after                         pending pathology results are reviewed.                        - The findings and recommendations were discussed with                         the patient.                        - The findings and recommendations were discussed with                         the patient's family.                        - Return to primary care  physician as previously                         scheduled.                        - High fiber diet. Procedure Code(s):     --- Professional ---                        [phone], Colonoscopy, flexible; with removal of                         tumor(s), polyp(s), or other lesion(s) by snare                         technique Diagnosis Code(s):     --- Professional ---                        Z12.11, Encounter for screening for malignant neoplasm                         of colon                        K63.5, Polyp of colon CPT copyright 2019 American Medical Association. All rights reserved. The codes documented in this report are preliminary and upon coder review may  be revised to meet current compliance requirements.  [name], MD Margretta Sidle B. Bonna Gains MD, MD 08/06/2021 11:28:09 AM This report has been signed electronically. Number of Addenda: 0 Note Initiated On: 08/06/2021 10:48 AM Scope Withdrawal Time: 0 hours 16 minutes 13 seconds  Total Procedure Duration: 0 hours 20 minutes 8 seconds  Estimated Blood Loss:  Estimated blood loss: none.      [facility]

## 2021-08-09 ENCOUNTER — Encounter: Payer: Self-pay | Admitting: Gastroenterology

## 2021-08-09 LAB — SURGICAL PATHOLOGY

## 2021-08-10 ENCOUNTER — Encounter: Payer: Self-pay | Admitting: Gastroenterology

## 2021-12-13 IMAGING — MG DIGITAL SCREENING BILAT W/ TOMO W/ CAD
6 of 9 series · 6 of 25 positions shown · non-contrast
Comparison: Previous exam(s).

ACR Breast Density Category a: The breast tissue is almost entirely
fatty.

CLINICAL DATA: Screening.

EXAM:
DIGITAL SCREENING BILATERAL MAMMOGRAM WITH TOMO AND CAD

[R CC]
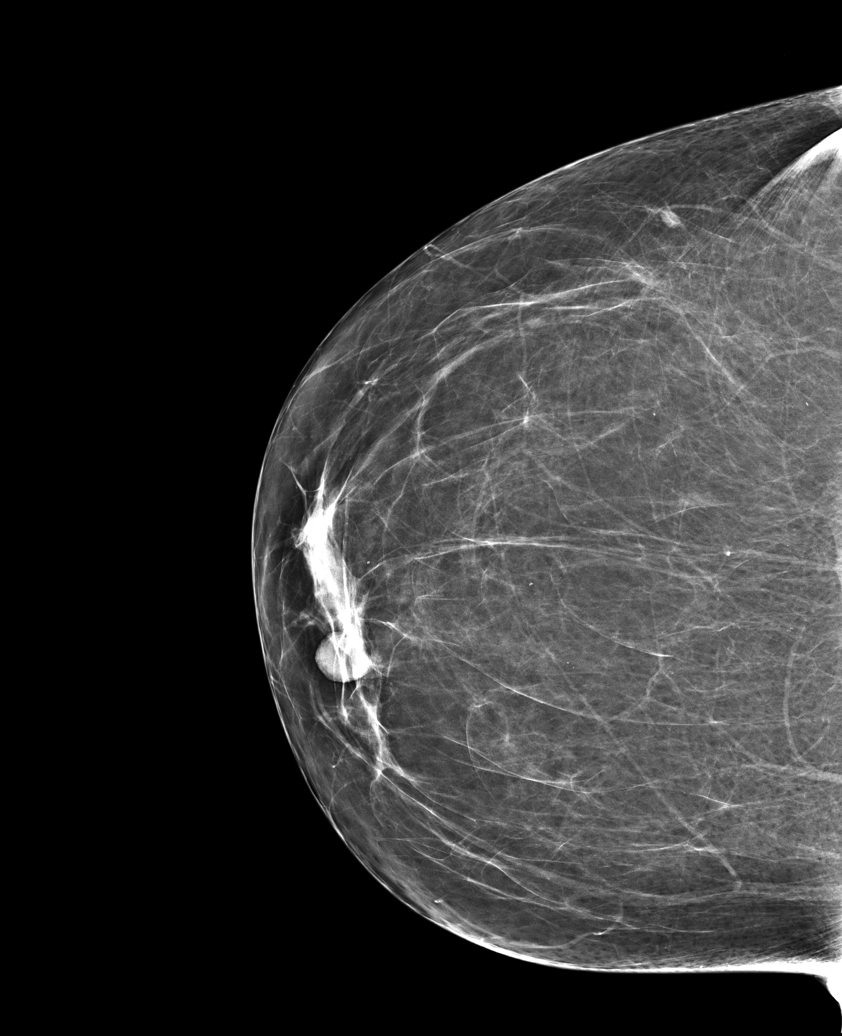

[R CC synth-2D]
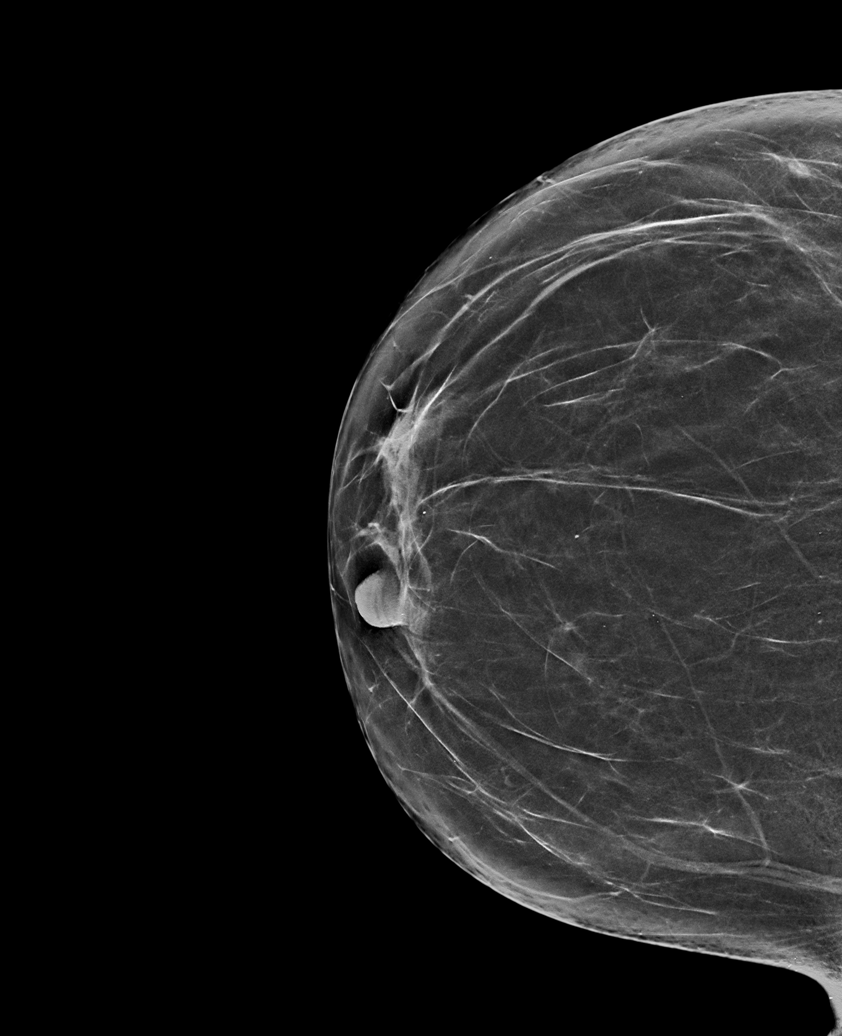

[L MLO synth-2D]
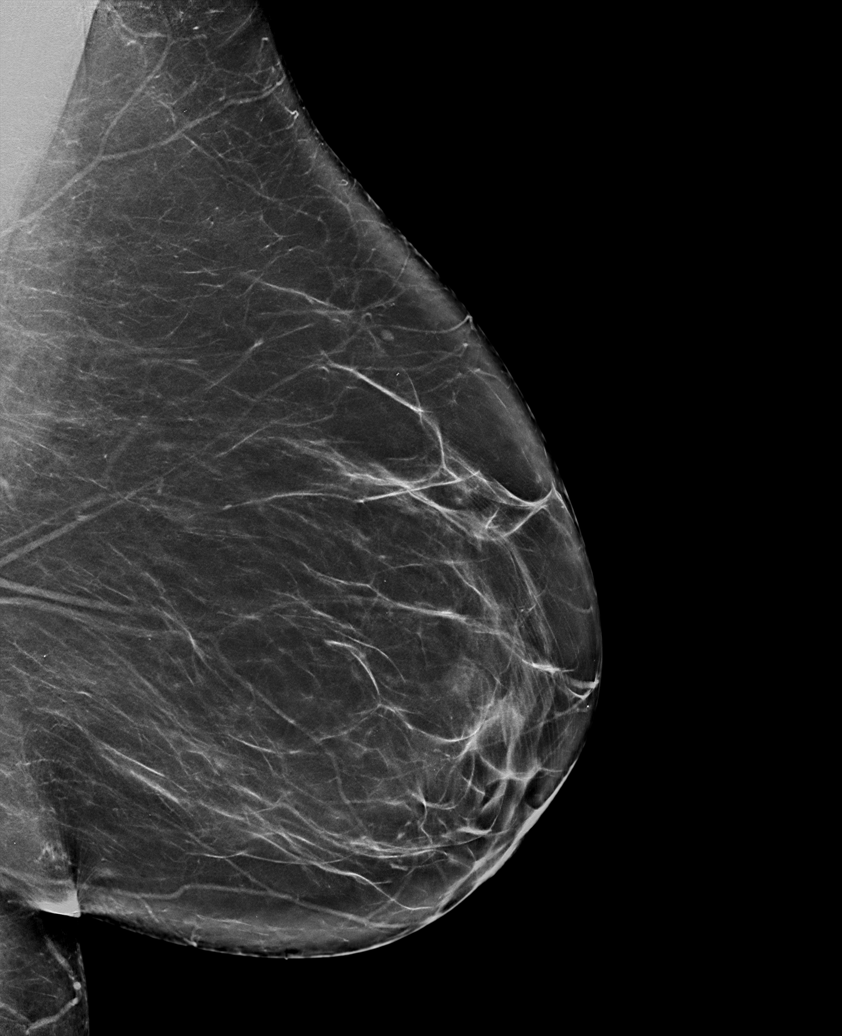

[L CC synth-2D]
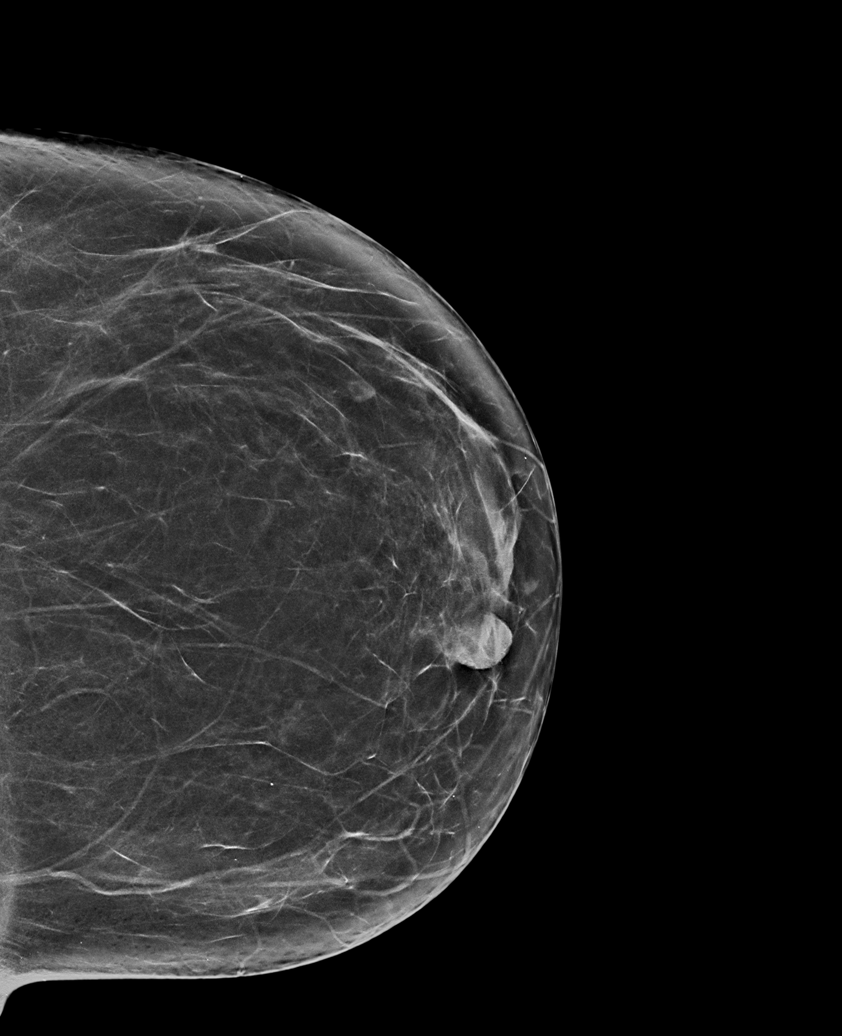

[R MLO synth-2D]
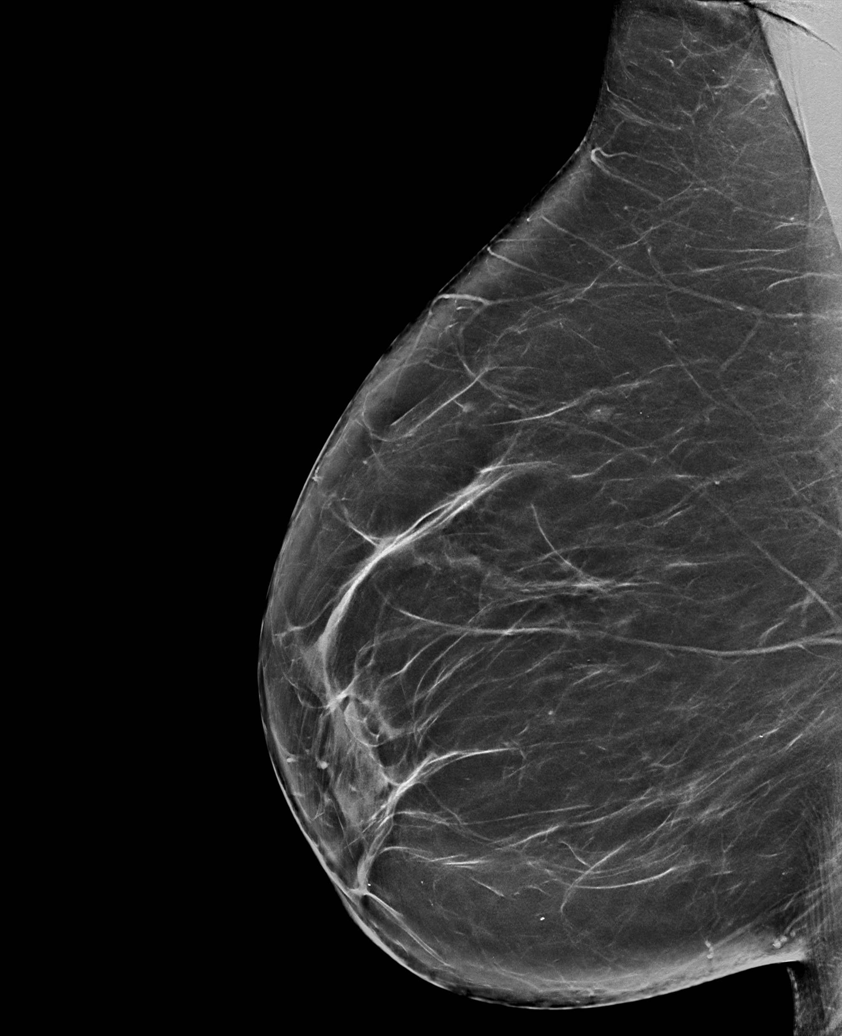

[R MLO tomo · tomo slice 42/83.0]
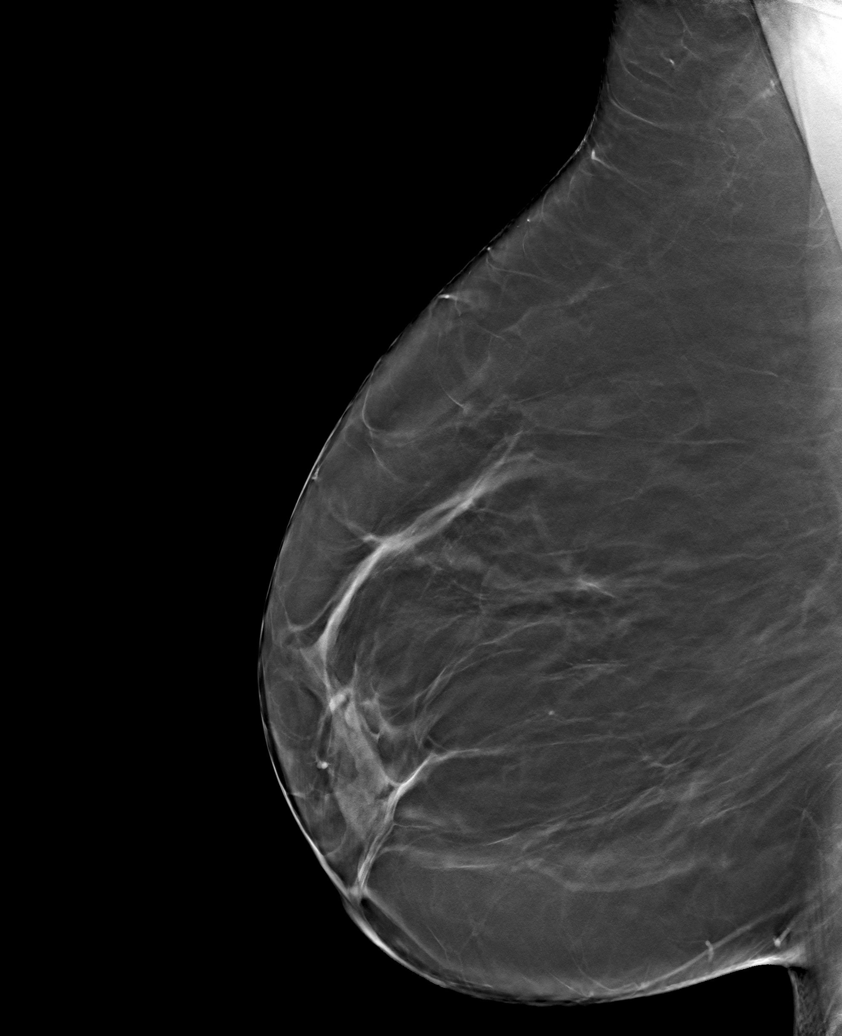

[6 of 25 positions shown; findings below may reference images not displayed]

FINDINGS: There are no findings suspicious for malignancy. Images were
processed with CAD.
IMPRESSION: No mammographic evidence of malignancy. A result letter of this
screening mammogram will be mailed directly to the patient.

RECOMMENDATION:
Screening mammogram in one year. (Code:[id number])

BI-RADS CATEGORY  1: Negative.

## 2022-12-07 ENCOUNTER — Other Ambulatory Visit: Payer: Self-pay | Admitting: Family Medicine

## 2022-12-07 DIAGNOSIS — Z1231 Encounter for screening mammogram for malignant neoplasm of breast: Secondary | ICD-10-CM

## 2022-12-22 ENCOUNTER — Ambulatory Visit
Admission: RE | Admit: 2022-12-22 | Discharge: 2022-12-22 | Disposition: A | Discharge status: Home / Self Care | Payer: No Typology Code available for payment source | Source: Ambulatory Visit | Attending: Family Medicine | Admitting: Family Medicine

## 2022-12-22 DIAGNOSIS — Z1231 Encounter for screening mammogram for malignant neoplasm of breast: Secondary | ICD-10-CM | POA: Diagnosis present

## 2022-12-28 ENCOUNTER — Other Ambulatory Visit: Payer: Self-pay | Admitting: Family Medicine

## 2022-12-28 DIAGNOSIS — Z1231 Encounter for screening mammogram for malignant neoplasm of breast: Secondary | ICD-10-CM

## 2024-02-20 ENCOUNTER — Other Ambulatory Visit: Payer: Self-pay | Admitting: Obstetrics

## 2024-02-20 ENCOUNTER — Other Ambulatory Visit: Payer: Self-pay

## 2024-02-20 ENCOUNTER — Other Ambulatory Visit (HOSPITAL_COMMUNITY)
Admission: RE | Admit: 2024-02-20 | Discharge: 2024-02-20 | Disposition: A | Discharge status: Home / Self Care | Source: Ambulatory Visit | Attending: Obstetrics | Admitting: Obstetrics

## 2024-02-20 ENCOUNTER — Ambulatory Visit (INDEPENDENT_AMBULATORY_CARE_PROVIDER_SITE_OTHER): Admitting: Obstetrics

## 2024-02-20 ENCOUNTER — Encounter: Payer: Self-pay | Admitting: Obstetrics

## 2024-02-20 VITALS — BP 129/79 | HR 66 | Ht 60.0 in | Wt 297.0 lb

## 2024-02-20 DIAGNOSIS — Z01419 Encounter for gynecological examination (general) (routine) without abnormal findings: Secondary | ICD-10-CM | POA: Insufficient documentation

## 2024-02-20 DIAGNOSIS — Z124 Encounter for screening for malignant neoplasm of cervix: Secondary | ICD-10-CM | POA: Diagnosis present

## 2024-02-20 DIAGNOSIS — R32 Unspecified urinary incontinence: Secondary | ICD-10-CM

## 2024-02-20 DIAGNOSIS — Z1231 Encounter for screening mammogram for malignant neoplasm of breast: Secondary | ICD-10-CM

## 2024-02-20 DIAGNOSIS — Z0000 Encounter for general adult medical examination without abnormal findings: Secondary | ICD-10-CM

## 2024-02-20 MED ORDER — ESTROGENS CONJUGATED 0.625 MG/GM VA CREA: TOPICAL_CREAM | VAGINAL | 1 refills | Status: DC

## 2024-02-20 NOTE — Progress Notes (Signed)
 ANNUAL GYNECOLOGICAL EXAM  SUBJECTIVE  HPI  Yolanda Stephens is a 63 y.o.-year-old [id number] who presents for an annual gynecological exam today.  She denies pelvic pain, dyspareunia, abnormal vaginal bleeding or discharge, and UTI symptoms. She reports urge incontinence for the past 1-1.5 years as well as a feeling of pressure from her uterus. She is sexually active about 5 times/year. She had a normal Pap 3 years ago but would like that repeated today.  Medical/Surgical History Past Medical History:  Diagnosis Date   Bradycardia    Chest pain    Fibroids    GERD (gastroesophageal reflux disease)    Heart palpitations    Hiatal hernia    Hypertension    Hypothyroidism    IBS (irritable bowel syndrome)    Migraines    Monoclonal gammopathy    Obesity    Sleep apnea    Varicose veins of both lower extremities    Past Surgical History:  Procedure Laterality Date   COLONOSCOPY  2011   COLONOSCOPY N/A 08/19/2016   Procedure: COLONOSCOPY;  Surgeon: Cassie Click, MD;  Location: [facility] ENDOSCOPY;  Service: Endoscopy;  Laterality: N/A;   COLONOSCOPY WITH PROPOFOL  N/A 08/06/2021   Procedure: COLONOSCOPY WITH PROPOFOL ;  Surgeon: Irby Mannan, MD;  Location: [facility] ENDOSCOPY;  Service: Endoscopy;  Laterality: N/A;   ESOPHAGOGASTRODUODENOSCOPY      Social History Lives with mom and sister. Feels safe there Work: works with mentally challenged people Exercise: none currently Substances: Denies EtOH, tobacco, vape, and recreational drugs  Obstetric History OB History     Gravida  0   Para  0   Term  0   Preterm  0   AB  0   Living  0      SAB  0   IAB  0   Ectopic  0   Multiple  0   Live Births  0            GYN/Menstrual History No LMP recorded. Patient is postmenopausal. Last Pap:  06/03/21 - NILM, negative HPV Contraception: post-menopause  Prevention Dentist: has not been recently Eye exam: regular exams Mammogram: yearly Colonoscopy:  had 3-4 years ago  Current Medications Outpatient Medications Prior to Visit  Medication Sig   aspirin EC 81 MG tablet Take 81 mg by mouth daily.   CINNAMON PO Take by mouth.   ibuprofen  (ADVIL ,MOTRIN ) 800 MG tablet Take 1 tablet (800 mg total) by mouth 3 (three) times daily.   Multiple Vitamin (MULTIVITAMIN) tablet Take 1 tablet by mouth daily.   Multiple Vitamins-Minerals (ONE DAILY CALCIUM/IRON) TABS Take 1 tablet by mouth daily.   OVER THE COUNTER MEDICATION Take 1 tablet by mouth daily.   triamcinolone ointment (KENALOG) 0.1 % Apply 1 application topically 2 (two) times daily.   triamterene-hydrochlorothiazide (DYAZIDE) 37.5-25 MG capsule 1 capsule daily.    TURMERIC CURCUMIN PO Take by mouth.   No facility-administered medications prior to visit.        ROS Constitutional: Denied constitutional symptoms, night sweats, recent illness, fatigue, fever, insomnia and weight loss.  Eyes: Denied eye symptoms, eye pain, photophobia, vision change and visual disturbance.  Ears/Nose/Throat/Neck: Denied ear, nose, throat or neck symptoms, hearing loss, nasal discharge, sinus congestion and sore throat.  Cardiovascular: Denied cardiovascular symptoms, arrhythmia, chest pain/pressure, edema, exercise intolerance, orthopnea and palpitations.  Respiratory: Denied pulmonary symptoms, asthma, pleuritic pain, productive sputum, cough, dyspnea and wheezing.  Gastrointestinal: Denied gastro-esophageal reflux, melena, nausea and vomiting.  Genitourinary: See  HPI  Musculoskeletal: Denied musculoskeletal symptoms, stiffness, swelling, muscle weakness and myalgia.  Dermatologic: Denied dermatology symptoms, rash and scar.  Neurologic: Denied neurology symptoms, dizziness, headache, neck pain and syncope.  Psychiatric: Denied psychiatric symptoms, anxiety and depression.  Endocrine: Denied endocrine symptoms including hot flashes and night sweats.    OBJECTIVE  BP 129/79   Pulse 66   Ht 5'  (1.524 m)   Wt 297 lb (134.7 kg)   BMI 58.00 kg/m    Physical examination General NAD, Conversant  HEENT Atraumatic; Op clear with mmm.  Normo-cephalic. Pupils reactive. Anicteric sclerae  Thyroid /Neck Smooth without nodularity or enlargement. Normal ROM.  Neck Supple.  Skin No rashes, lesions or ulceration. Normal palpated skin turgor. No nodularity.  Breasts: Declined exam  Lungs: Clear to auscultation.No rales or wheezes. Normal Respiratory effort, no retractions.  Heart: NSR.  No murmurs or rubs appreciated. No peripheral edema  Abdomen: Soft.  Non-tender.  No masses.  No HSM. No hernia  Extremities: Moves all appropriately.  Normal ROM for age. No lymphadenopathy.  Neuro: Oriented to PPT.  Normal mood. Normal affect.     Pelvic:   Vulva: Normal appearance.  No lesions.  Vagina: No lesions or abnormalities noted.  Support: Normal pelvic support.  Urethra Possible urethral caruncle  Meatus Normal size without lesions or prolapse.  Cervix: Normal appearance.  No lesions.  Perineum: Normal exam.  No lesions.        Bimanual   Uterus: Normal size.  Non-tender.  Mobile.  AV.  Adnexae: No masses.  Non-tender to palpation.  Cul-de-sac: Negative for abnormality.    ASSESSMENT  1) Annual exam 2) Urethral caruncle 3) Urge incontinence  PLAN 1) Physical exam as noted. Discussed healthy lifestyle choices and preventive care. Declines STI testing. Routine labs done with PCP. Pap collected per pt preference. Mammogram ordered. 2) Consulted with Dr. Luster Salters. Recommend estrogen cream twice a week. Rx sent 3) Referral sent for pelvic PT  Return in one year for annual exam or as needed for concerns.   Jamillia Closson, CNM

## 2024-02-22 LAB — CYTOLOGY - PAP
Adequacy: ABSENT
Comment: NEGATIVE
Diagnosis: NEGATIVE
High risk HPV: NEGATIVE

## 2024-02-26 ENCOUNTER — Ambulatory Visit: Payer: Self-pay | Admitting: Obstetrics

## 2024-04-02 ENCOUNTER — Inpatient Hospital Stay: Attending: Oncology | Admitting: Oncology

## 2024-04-02 ENCOUNTER — Encounter: Payer: Self-pay | Admitting: Oncology

## 2024-04-02 ENCOUNTER — Inpatient Hospital Stay

## 2024-04-02 VITALS — BP 122/62 | HR 67 | Temp 97.8°F | Resp 18 | Ht 60.0 in | Wt 297.0 lb

## 2024-04-02 DIAGNOSIS — Z8042 Family history of malignant neoplasm of prostate: Secondary | ICD-10-CM | POA: Insufficient documentation

## 2024-04-02 DIAGNOSIS — Z800 Family history of malignant neoplasm of digestive organs: Secondary | ICD-10-CM | POA: Diagnosis not present

## 2024-04-02 DIAGNOSIS — R768 Other specified abnormal immunological findings in serum: Secondary | ICD-10-CM | POA: Insufficient documentation

## 2024-04-02 DIAGNOSIS — R778 Other specified abnormalities of plasma proteins: Secondary | ICD-10-CM | POA: Diagnosis not present

## 2024-04-02 DIAGNOSIS — Z803 Family history of malignant neoplasm of breast: Secondary | ICD-10-CM | POA: Diagnosis not present

## 2024-04-02 LAB — COMPREHENSIVE METABOLIC PANEL WITH GFR
ALT: 17 U/L (ref 0–44)
AST: 18 U/L (ref 15–41)
Albumin: 4 g/dL (ref 3.5–5.0)
Alkaline Phosphatase: 65 U/L (ref 38–126)
Anion gap: 11 (ref 5–15)
BUN: 13 mg/dL (ref 8–23)
CO2: 25 mmol/L (ref 22–32)
Calcium: 9.2 mg/dL (ref 8.9–10.3)
Chloride: 100 mmol/L (ref 98–111)
Creatinine, Ser: 0.6 mg/dL (ref 0.44–1.00)
GFR, Estimated: >60 mL/min (ref >60)
Glucose, Bld: 89 mg/dL (ref 70–99)
Potassium: 3.6 mmol/L (ref 3.5–5.1)
Sodium: 136 mmol/L (ref 135–145)
Total Bilirubin: 0.8 mg/dL (ref 0.0–1.2)
Total Protein: 8.7 g/dL — ABNORMAL HIGH (ref 6.5–8.1)

## 2024-04-02 LAB — CBC WITH DIFFERENTIAL/PLATELET
Abs Immature Granulocytes: 0.02 10*3/uL (ref 0.00–0.07)
Basophils Absolute: 0 10*3/uL (ref 0.0–0.1)
Basophils Relative: 1 %
Eosinophils Absolute: 0.1 10*3/uL (ref 0.0–0.5)
Eosinophils Relative: 2 %
HCT: 38.4 % (ref 36.0–46.0)
Hemoglobin: 12.5 g/dL (ref 12.0–15.0)
Immature Granulocytes: 0 %
Lymphocytes Relative: 21 %
Lymphs Abs: 1.3 10*3/uL (ref 0.7–4.0)
MCH: 29.8 pg (ref 26.0–34.0)
MCHC: 32.6 g/dL (ref 30.0–36.0)
MCV: 91.4 fL (ref 80.0–100.0)
Monocytes Absolute: 0.8 10*3/uL (ref 0.1–1.0)
Monocytes Relative: 12 %
Neutro Abs: 3.9 10*3/uL (ref 1.7–7.7)
Neutrophils Relative %: 64 %
Platelets: 257 10*3/uL (ref 150–400)
RBC: 4.2 MIL/uL (ref 3.87–5.11)
RDW: 12.1 % (ref 11.5–15.5)
WBC: 6.2 10*3/uL (ref 4.0–10.5)
nRBC: 0 % (ref 0.0–0.2)

## 2024-04-02 NOTE — Progress Notes (Signed)
 Patient's PCP referred her to us . She isn't really having any symptoms at all, besides some knee pain.

## 2024-04-03 LAB — KAPPA/LAMBDA LIGHT CHAINS
Kappa free light chain: 31.2 mg/L — ABNORMAL HIGH (ref 3.3–19.4)
Kappa, lambda light chain ratio: 1.24 (ref 0.26–1.65)
Lambda free light chains: 25.2 mg/L (ref 5.7–26.3)

## 2024-04-04 LAB — MULTIPLE MYELOMA PANEL, SERUM
Albumin SerPl Elph-Mcnc: 3.9 g/dL (ref 2.9–4.4)
Albumin/Glob SerPl: 1 (ref 0.7–1.7)
Alpha 1: 0.2 g/dL (ref 0.0–0.4)
Alpha2 Glob SerPl Elph-Mcnc: 0.6 g/dL (ref 0.4–1.0)
B-Globulin SerPl Elph-Mcnc: 1.4 g/dL — ABNORMAL HIGH (ref 0.7–1.3)
Gamma Glob SerPl Elph-Mcnc: 2 g/dL — ABNORMAL HIGH (ref 0.4–1.8)
Globulin, Total: 4.3 g/dL — ABNORMAL HIGH (ref 2.2–3.9)
IgA: 716 mg/dL — ABNORMAL HIGH (ref 87–352)
IgG (Immunoglobin G), Serum: 2039 mg/dL — ABNORMAL HIGH (ref 586–1602)
IgM (Immunoglobulin M), Srm: 159 mg/dL (ref 26–217)
Total Protein ELP: 8.2 g/dL (ref 6.0–8.5)

## 2024-04-05 NOTE — Progress Notes (Signed)
 Hematology/Oncology Consult note [facility] [phone] [phone]  Patient Care Team: [facility] as PCP - General   Name of the patient: Yolanda Stephens  [id number]  01/18/1961    Reason for referral-abnormal SPEP   Referring physician-Dr. Zachary  Date of visit: 04/05/24   History of presenting illness- Patient is a 63 year old female with a past medical history significant for hypertension is referred for abnormal SPEP.  CBC from December 2024 showed H&H of 12.9/40.4 with an white count of 4.8 and platelet count of 228.  CMP was within normal limits except for a mildly elevated total protein of 8.7.  She underwent SPEP which showed no evidence of monoclonal protein but immunofixation could not rule out a faint monoclonal IgG component.  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  Patient referred for these concerns.  Overall patient is doing well.  She has some baseline fatigue and ongoing left knee pain   ECOG PS- 1  Pain scale- 3   Review of systems- Review of Systems  Constitutional:  Positive for malaise/fatigue. Negative for chills, fever and weight loss.  HENT:  Negative for congestion, ear discharge and nosebleeds.   Eyes:  Negative for blurred vision.  Respiratory:  Negative for cough, hemoptysis, sputum production, shortness of breath and wheezing.   Cardiovascular:  Negative for chest pain, palpitations, orthopnea and claudication.  Gastrointestinal:  Negative for abdominal pain, blood in stool, constipation, diarrhea, heartburn, melena, nausea and vomiting.  Genitourinary:  Negative for dysuria, flank pain, frequency, hematuria and urgency.  Musculoskeletal:  Positive for joint pain. Negative for back pain and myalgias.  Skin:  Negative for rash.  Neurological:  Negative for dizziness, tingling, focal weakness, seizures, weakness and headaches.  Endo/Heme/Allergies:  Does not bruise/bleed easily.   Psychiatric/Behavioral:  Negative for depression and suicidal ideas. The patient does not have insomnia.     Allergies  Allergen Reactions   Lisinopril Cough    Patient Active Problem List   Diagnosis Date Noted   Colon cancer screening    Cecal polyp    Incidental pulmonary nodule, greater than or equal to 8mm 05/06/2018   Renal lesion 05/06/2018   Monoclonal gammopathy    Family history of polyps in the colon 06/03/2016   Essential (primary) hypertension 01/09/2016   Benign essential HTN 03/19/2015   Bradycardia 09/17/2014   Breathlessness on exertion 09/17/2014   Chest pain 08/25/2014   Gastro-esophageal reflux disease without esophagitis 08/25/2014   Adynamia 08/25/2014   Awareness of heartbeats 08/25/2014   Anemia, iron deficiency 08/25/2014   Obstructive apnea 08/25/2014   Adult hypothyroidism 08/25/2014   Leg varices 08/25/2014   Fibroid 10/31/2013   Morbid obesity (HCC) 10/31/2013     Past Medical History:  Diagnosis Date   Bradycardia    Chest pain    Fibroids    GERD (gastroesophageal reflux disease)    Heart palpitations    Hiatal hernia    Hypertension    Hypothyroidism    IBS (irritable bowel syndrome)    Migraines    Monoclonal gammopathy    Obesity    Sleep apnea    Varicose veins of both lower extremities      Past Surgical History:  Procedure Laterality Date   COLONOSCOPY  2011   COLONOSCOPY N/A 08/19/2016   Procedure: COLONOSCOPY;  Surgeon: Lamar ONEIDA Holmes, MD;  Location: [facility] ENDOSCOPY;  Service: Endoscopy;  Laterality: N/A;   COLONOSCOPY WITH PROPOFOL  N/A 08/06/2021   Procedure: COLONOSCOPY WITH PROPOFOL ;  Surgeon:  Janalyn Keene NOVAK, MD;  Location: [facility] ENDOSCOPY;  Service: Endoscopy;  Laterality: N/A;   ESOPHAGOGASTRODUODENOSCOPY      Social History   Socioeconomic History   Marital status: Single    Spouse name: Not on file   Number of children: 0   Years of education: Not on file   Highest education level: Not on file   Occupational History   Occupation: Support Specialist  Tobacco Use   Smoking status: Never   Smokeless tobacco: Never  Vaping Use   Vaping status: Never Used  Substance and Sexual Activity   Alcohol use: No   Drug use: No   Sexual activity: Not Currently    Birth control/protection: Post-menopausal  Other Topics Concern   Not on file  Social History Narrative   Not on file   Social Drivers of Health   Financial Resource Strain: Medium Risk (12/06/2023)   Received from [facility]   Overall Financial Resource Strain (CARDIA)    Difficulty of Paying Living Expenses: Somewhat hard  Food Insecurity: No Food Insecurity (04/02/2024)   Hunger Vital Sign    Worried About Running Out of Food in the Last Year: Never true    Ran Out of Food in the Last Year: Never true  Transportation Needs: No Transportation Needs (12/06/2023)   Received from [facility]   PRAPARE - Transportation    In the past 12 months, has lack of transportation kept you from medical appointments or from getting medications?: No    Lack of Transportation (Non-Medical): No  Physical Activity: Sufficiently Active (11/30/2017)   Exercise Vital Sign    Days of Exercise per Week: 3 days    Minutes of Exercise per Session: 60 min  Stress: No Stress Concern Present (11/30/2017)   [employer] of Occupational Health - Occupational Stress Questionnaire    Feeling of Stress : Only a little  Social Connections: Moderately Isolated (11/30/2017)   Social Connection and Isolation Panel    Frequency of Communication with Friends and Family: More than three times a week    Frequency of Social Gatherings with Friends and Family: More than three times a week    Attends Religious Services: Never    Database administrator or Organizations: No    Attends Banker Meetings: Never    Marital Status: Never married  Intimate Partner Violence: Not At Risk (11/30/2017)   Humiliation,  Afraid, Rape, and Kick questionnaire    Fear of Current or Ex-Partner: No    Emotionally Abused: No    Physically Abused: No    Sexually Abused: No     Family History  Problem Relation Age of Onset   Breast cancer Paternal Aunt 82       paternal great aunt   Diabetes Mother    Hypertension Mother    Osteoarthritis Mother    Deep vein thrombosis Mother    Diabetes Father    Hypertension Father    Hyperlipidemia Father    Peripheral vascular disease Father    Deep vein thrombosis Father    Prostate cancer Father    Hypertension Sister    Depression Sister    Deep vein thrombosis Sister    Epilepsy Brother    Colon cancer Maternal Grandmother    Diabetes Maternal Grandmother    Diabetes Maternal Grandfather    Hyperlipidemia Maternal Grandfather    Diabetes Paternal Grandmother    Mental retardation Brother    Mental retardation Brother  Hypertension Brother    Diabetes Brother    Peripheral vascular disease Brother    Diabetes Maternal Aunt      Current Outpatient Medications:    aspirin EC 81 MG tablet, Take 81 mg by mouth daily., Disp: , Rfl:    CINNAMON PO, Take by mouth., Disp: , Rfl:    estradiol (ESTRACE) 0.1 MG/GM vaginal cream, Place 0.25 Applicatorfuls vaginally at bedtime., Disp: 90 g, Rfl: 3   ibuprofen  (ADVIL ,MOTRIN ) 800 MG tablet, Take 1 tablet (800 mg total) by mouth 3 (three) times daily., Disp: 21 tablet, Rfl: 0   Multiple Vitamin (MULTIVITAMIN) tablet, Take 1 tablet by mouth daily., Disp: , Rfl:    Multiple Vitamins-Minerals (ONE DAILY CALCIUM/IRON) TABS, Take 1 tablet by mouth daily., Disp: , Rfl:    OVER THE COUNTER MEDICATION, Take 1 tablet by mouth daily., Disp: , Rfl:    triamcinolone ointment (KENALOG) 0.1 %, Apply 1 application topically 2 (two) times daily., Disp: , Rfl:    triamterene-hydrochlorothiazide (DYAZIDE) 37.5-25 MG capsule, 1 capsule daily. , Disp: , Rfl:    TURMERIC CURCUMIN PO, Take by mouth., Disp: , Rfl:    Physical exam:   Vitals:   04/02/24 1447  BP: 122/62  Pulse: 67  Resp: 18  Temp: 97.8 F (36.6 C)  TempSrc: Tympanic  SpO2: 97%  Weight: 297 lb (134.7 kg)  Height: 5' (1.524 m)   Physical Exam  Cardiovascular:     Rate and Rhythm: Normal rate and regular rhythm.     Heart sounds: Normal heart sounds.  Pulmonary:     Effort: Pulmonary effort is normal.     Breath sounds: Normal breath sounds.  Abdominal:     General: Bowel sounds are normal.   Skin:    General: Skin is warm and dry.   Neurological:     Mental Status: She is alert and oriented to person, place, and time.           Latest Ref Rng & Units 04/02/2024    3:19 PM  CMP  Glucose 70 - 99 mg/dL 89   BUN 8 - 23 mg/dL 13   Creatinine 9.55 - 1.00 mg/dL 9.39   Sodium 864 - 854 mmol/L 136   Potassium 3.5 - 5.1 mmol/L 3.6   Chloride 98 - 111 mmol/L 100   CO2 22 - 32 mmol/L 25   Calcium 8.9 - 10.3 mg/dL 9.2   Total Protein 6.5 - 8.1 g/dL 8.7   Total Bilirubin 0.0 - 1.2 mg/dL 0.8   Alkaline Phos 38 - 126 U/L 65   AST 15 - 41 U/L 18   ALT 0 - 44 U/L 17       Latest Ref Rng & Units 04/02/2024    3:19 PM  CBC  WBC 4.0 - 10.5 K/uL 6.2   Hemoglobin 12.0 - 15.0 g/dL 87.4   Hematocrit 63.9 - 46.0 % 38.4   Platelets 150 - 400 K/uL 257      Assessment and plan- Patient is a 63 y.o. female referred for abnormal SPEP  Patient has no evidence of crab criteria including anemia or renal failure or hypercalcemia.  There is no M protein detected on SPEP but a small amount of IgG monoclonal protein could not be ruled out on immunofixation.  This was checked as patient was noted toHaving mild elevation in her total protein.  I am planning to check CBC with differential CMP myeloma panel and serum free light chains at this time.  I will also check 24-hour urine protein electrophoresis.  I will tentatively see her back in 1 week's time to discuss the results of blood work and further management.  If patient noted to have polyclonal elevation  in her protein with no evidence of M protein, this is not considered to be plasma cell dyscrasia and would not require any further workup from hematology standpoint.  Polyclonal elevation in immunoglobulins is nonspecific and can be seen with stress inflammation etc.   Thank you for this kind referral and the opportunity to participate in the care of this  Patient   Visit Diagnosis 1. Abnormal SPEP     Dr. Annah Skene, MD, MPH [facility] [phone] 04/05/2024

## 2024-04-09 ENCOUNTER — Inpatient Hospital Stay: Admitting: Oncology

## 2024-04-09 ENCOUNTER — Ambulatory Visit: Admitting: Oncology

## 2024-04-11 ENCOUNTER — Encounter

## 2024-04-23 ENCOUNTER — Encounter

## 2024-06-05 ENCOUNTER — Inpatient Hospital Stay: Admitting: Oncology

## 2024-06-25 ENCOUNTER — Inpatient Hospital Stay: Attending: Oncology | Admitting: Oncology

## 2024-06-25 ENCOUNTER — Encounter: Payer: Self-pay | Admitting: Oncology

## 2024-06-25 VITALS — BP 150/89 | HR 74 | Temp 98.9°F | Resp 16 | Wt 283.0 lb

## 2024-06-25 DIAGNOSIS — R778 Other specified abnormalities of plasma proteins: Secondary | ICD-10-CM | POA: Diagnosis not present

## 2024-06-25 DIAGNOSIS — R768 Other specified abnormal immunological findings in serum: Secondary | ICD-10-CM | POA: Diagnosis not present

## 2024-06-25 DIAGNOSIS — Z800 Family history of malignant neoplasm of digestive organs: Secondary | ICD-10-CM | POA: Insufficient documentation

## 2024-06-25 DIAGNOSIS — Z803 Family history of malignant neoplasm of breast: Secondary | ICD-10-CM | POA: Insufficient documentation

## 2024-06-25 DIAGNOSIS — Z8042 Family history of malignant neoplasm of prostate: Secondary | ICD-10-CM | POA: Insufficient documentation

## 2024-06-25 DIAGNOSIS — D890 Polyclonal hypergammaglobulinemia: Secondary | ICD-10-CM | POA: Insufficient documentation

## 2024-06-25 NOTE — Progress Notes (Signed)
 Hematology/Oncology Consult note [facility]  [phone] [phone]  Patient Care Team: [facility] as PCP - General   Name of the patient: Yolanda Stephens  [id number]  1961-05-31   Date of visit: 06/25/24  Diagnosis- polyclonal gammopathy  Chief complaint/ Reason for visit- discuss results of bloodwork  Heme/Onc history: Patient is a 63 year old female with a past medical history significant for hypertension is referred for abnormal SPEP.  CBC from December 2024 showed H&H of 12.9/40.4 with an white count of 4.8 and platelet count of 228.  CMP was within normal limits except for a mildly elevated total protein of 8.7.  She underwent SPEP which showed no evidence of monoclonal protein but immunofixation could not rule out a faint monoclonal IgG component.  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  Patient referred for these concerns.     Results of blood work from 04/02/2024 were as follows: CBC showed white cell count of 6.2, H&H of 12.5/38.4 and a platelet count of 257.  CMP showed normal creatinine of 0.6.  Total protein is mildly elevated at 8.7 Ilopan panel showed no evidence of M protein.  Polyclonal increase in immunoglobulins.  Kappa free light chain mildly elevated at 31.0 with a normal free light chain ratio  Interval history- no acute issues since last visit. Patient has baseline fatigue and chronic joint pain  ECOG PS- 1 Pain scale- 0   Review of systems- Review of Systems  Constitutional:  Positive for malaise/fatigue. Negative for chills, fever and weight loss.  HENT:  Negative for congestion, ear discharge and nosebleeds.   Eyes:  Negative for blurred vision.  Respiratory:  Negative for cough, hemoptysis, sputum production, shortness of breath and wheezing.   Cardiovascular:  Negative for chest pain, palpitations, orthopnea and claudication.  Gastrointestinal:  Negative for abdominal pain, blood in stool,  constipation, diarrhea, heartburn, melena, nausea and vomiting.  Genitourinary:  Negative for dysuria, flank pain, frequency, hematuria and urgency.  Musculoskeletal:  Positive for joint pain. Negative for back pain and myalgias.  Skin:  Negative for rash.  Neurological:  Negative for dizziness, tingling, focal weakness, seizures, weakness and headaches.  Endo/Heme/Allergies:  Does not bruise/bleed easily.  Psychiatric/Behavioral:  Negative for depression and suicidal ideas. The patient does not have insomnia.       Allergies  Allergen Reactions   Lisinopril Cough     Past Medical History:  Diagnosis Date   Bradycardia    Chest pain    Fibroids    GERD (gastroesophageal reflux disease)    Heart palpitations    Hiatal hernia    Hypertension    Hypothyroidism    IBS (irritable bowel syndrome)    Migraines    Monoclonal gammopathy    Obesity    Sleep apnea    Varicose veins of both lower extremities      Past Surgical History:  Procedure Laterality Date   COLONOSCOPY  2011   COLONOSCOPY N/A 08/19/2016   Procedure: COLONOSCOPY;  Surgeon: Lamar ONEIDA Holmes, MD;  Location: [facility] END[facility]COPY;  Service: Endoscopy;  Laterality: N/A;   COLONOSCOPY WITH PROPOFOL  N/A 08/06/2021   Procedure: COLONOSCOPY WITH PROPOFOL ;  Surgeon: Janalyn Keene NOVAK, MD;  Location: [facility] ENDOSCOPY;  Service: Endoscopy;  Laterality: N/A;   ESOPHAGOGASTRODUODENOSCOPY      Social History   Socioeconomic History   Marital status: Single    Spouse name: Not on file   Number of children: 0   Years of education: Not on file  Highest education level: Not on file  Occupational History   Occupation: Support Specialist  Tobacco Use   Smoking status: Never   Smokeless tobacco: Never  Vaping Use   Vaping status: Never Used  Substance and Sexual Activity   Alcohol use: No   Drug use: No   Sexual activity: Not Currently    Birth control/protection: Post-menopausal  Other Topics Concern   Not on file   Social History Narrative   Not on file   Social Drivers of Health   Financial Resource Strain: Medium Risk (12/06/2023)   Received from [facility]   Overall Financial Resource Strain (CARDIA)    Difficulty of Paying Living Expenses: Somewhat hard  Food Insecurity: No Food Insecurity (04/02/2024)   Hunger Vital Sign    Worried About Running Out of Food in the Last Year: Never true    Ran Out of Food in the Last Year: Never true  Transportation Needs: No Transportation Needs (12/06/2023)   Received from [facility] - Transportation    In the past 12 months, has lack of transportation kept you from medical appointments or from getting medications?: No    Lack of Transportation (Non-Medical): No  Physical Activity: Sufficiently Active (11/30/2017)   Exercise Vital Sign    Days of Exercise per Week: 3 days    Minutes of Exercise per Session: 60 min  Stress: No Stress Concern Present (11/30/2017)   [employer] of Occupational Health - Occupational Stress Questionnaire    Feeling of Stress : Only a little  Social Connections: Moderately Isolated (11/30/2017)   Social Connection and Isolation Panel    Frequency of Communication with Friends and Family: More than three times a week    Frequency of Social Gatherings with Friends and Family: More than three times a week    Attends Religious Services: Never    Database administrator or Organizations: No    Attends Banker Meetings: Never    Marital Status: Never married  Intimate Partner Violence: Not At Risk (11/30/2017)   Humiliation, Afraid, Rape, and Kick questionnaire    Fear of Current or Ex-Partner: No    Emotionally Abused: No    Physically Abused: No    Sexually Abused: No    Family History  Problem Relation Age of Onset   Breast cancer Paternal Aunt 84       paternal great aunt   Diabetes Mother    Hypertension Mother    Osteoarthritis Mother    Deep vein  thrombosis Mother    Diabetes Father    Hypertension Father    Hyperlipidemia Father    Peripheral vascular disease Father    Deep vein thrombosis Father    Prostate cancer Father    Hypertension Sister    Depression Sister    Deep vein thrombosis Sister    Epilepsy Brother    Colon cancer Maternal Grandmother    Diabetes Maternal Grandmother    Diabetes Maternal Grandfather    Hyperlipidemia Maternal Grandfather    Diabetes Paternal Grandmother    Mental retardation Brother    Mental retardation Brother    Hypertension Brother    Diabetes Brother    Peripheral vascular disease Brother    Diabetes Maternal Aunt      Current Outpatient Medications:    aspirin EC 81 MG tablet, Take 81 mg by mouth daily., Disp: , Rfl:    CINNAMON PO, Take by mouth., Disp: , Rfl:  estradiol (ESTRACE) 0.1 MG/GM vaginal cream, Place 0.25 Applicatorfuls vaginally at bedtime., Disp: 90 g, Rfl: 3   ibuprofen  (ADVIL ,MOTRIN ) 800 MG tablet, Take 1 tablet (800 mg total) by mouth 3 (three) times daily., Disp: 21 tablet, Rfl: 0   Multiple Vitamin (MULTIVITAMIN) tablet, Take 1 tablet by mouth daily., Disp: , Rfl:    Multiple Vitamins-Minerals (ONE DAILY CALCIUM/IRON) TABS, Take 1 tablet by mouth daily., Disp: , Rfl:    OVER THE COUNTER MEDICATION, Take 1 tablet by mouth daily., Disp: , Rfl:    triamcinolone ointment (KENALOG) 0.1 %, Apply 1 application topically 2 (two) times daily., Disp: , Rfl:    triamterene-hydrochlorothiazide (DYAZIDE) 37.5-25 MG capsule, 1 capsule daily. , Disp: , Rfl:    TURMERIC CURCUMIN PO, Take by mouth., Disp: , Rfl:   Physical exam:  Vitals:   06/25/24 1343  BP: (!) 150/89  Pulse: 74  Resp: 16  Temp: 98.9 F (37.2 C)  TempSrc: Tympanic  SpO2: 99%  Weight: 283 lb (128.4 kg)   Physical Exam Cardiovascular:     Rate and Rhythm: Normal rate and regular rhythm.     Heart sounds: Normal heart sounds.  Pulmonary:     Effort: Pulmonary effort is normal.     Breath sounds:  Normal breath sounds.  Skin:    General: Skin is warm and dry.  Neurological:     Mental Status: She is alert and oriented to person, place, and time.      I have personally reviewed labs listed below:    Latest Ref Rng & Units 04/02/2024    3:19 PM  CMP  Glucose 70 - 99 mg/dL 89   BUN 8 - 23 mg/dL 13   Creatinine 9.55 - 1.00 mg/dL 9.39   Sodium 864 - 854 mmol/L 136   Potassium 3.5 - 5.1 mmol/L 3.6   Chloride 98 - 111 mmol/L 100   CO2 22 - 32 mmol/L 25   Calcium 8.9 - 10.3 mg/dL 9.2   Total Protein 6.5 - 8.1 g/dL 8.7   Total Bilirubin 0.0 - 1.2 mg/dL 0.8   Alkaline Phos 38 - 126 U/L 65   AST 15 - 41 U/L 18   ALT 0 - 44 U/L 17       Latest Ref Rng & Units 04/02/2024    3:19 PM  CBC  WBC 4.0 - 10.5 K/uL 6.2   Hemoglobin 12.0 - 15.0 g/dL 87.4   Hematocrit 63.9 - 46.0 % 38.4   Platelets 150 - 400 K/uL 257       Assessment and plan- Patient is a 63 y.o. female referred for abnormal spep  Discussed with the patient the results of blood work from June 2025 which does not show any evidence of M protein.  Myeloma panel shows polyclonal gammopathy which is nonspecific and can be seen with infection or inflammation and other conditions but does not reflect plasma cell dyscrasia.  As such she does not require any further workup from a hematology standpoint.  Although serum kappa light chain was mildly elevated her free light chain ratio was normal.  Her mildly elevated total protein is because of her polyclonal gammopathy.  I will see her back in 1 year with labs and if they are stable she does not require any further follow-up with me   Visit Diagnosis 1. Abnormal SPEP      Dr. Annah Skene, MD, MPH [facility] [phone] 06/25/2024 1:47 PM

## 2024-06-25 NOTE — Progress Notes (Signed)
Pt in for follow up and lab results.   

## 2025-06-25 ENCOUNTER — Ambulatory Visit: Admitting: Oncology

## 2025-06-25 ENCOUNTER — Other Ambulatory Visit
